# Patient Record
Sex: Male | Born: 1990 | Race: White | Hispanic: No | Marital: Married | Smoking: Former smoker
Health system: Southern US, Community
[De-identification: ages and names within clinical notes are randomized; demographics above are authoritative.]

---
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
## 2012-03-09 ENCOUNTER — Encounter (HOSPITAL_COMMUNITY): Payer: Self-pay

## 2012-03-09 ENCOUNTER — Emergency Department (HOSPITAL_COMMUNITY)
Admission: EM | Admit: 2012-03-09 | Discharge: 2012-03-09 | Disposition: A | Discharge status: Home / Self Care | Payer: Self-pay | Attending: Emergency Medicine | Admitting: Emergency Medicine

## 2012-03-09 ENCOUNTER — Emergency Department (HOSPITAL_COMMUNITY): Payer: Self-pay

## 2012-03-09 DIAGNOSIS — F17200 Nicotine dependence, unspecified, uncomplicated: Secondary | ICD-10-CM | POA: Insufficient documentation

## 2012-03-09 DIAGNOSIS — R042 Hemoptysis: Secondary | ICD-10-CM | POA: Insufficient documentation

## 2012-03-09 DIAGNOSIS — IMO0001 Reserved for inherently not codable concepts without codable children: Secondary | ICD-10-CM | POA: Insufficient documentation

## 2012-03-09 DIAGNOSIS — R05 Cough: Secondary | ICD-10-CM

## 2012-03-09 DIAGNOSIS — R070 Pain in throat: Secondary | ICD-10-CM | POA: Insufficient documentation

## 2012-03-09 LAB — RAPID STREP SCREEN (MED CTR MEBANE ONLY): Streptococcus, Group A Screen (Direct): NEGATIVE

## 2012-03-09 LAB — CBC WITH DIFFERENTIAL/PLATELET
Basophils Absolute: 0.1 10*3/uL (ref 0.0–0.1)
Lymphocytes Relative: 30 % (ref 12–46)
Lymphs Abs: 2.3 10*3/uL (ref 0.7–4.0)
MCV: 89.5 fL (ref 78.0–100.0)
Neutro Abs: 4.3 10*3/uL (ref 1.7–7.7)
Platelets: 238 10*3/uL (ref 150–400)
RBC: 5.71 MIL/uL (ref 4.22–5.81)
RDW: 11.8 % (ref 11.5–15.5)
WBC: 7.5 10*3/uL (ref 4.0–10.5)

## 2012-03-09 LAB — BASIC METABOLIC PANEL
CO2: 29 mEq/L (ref 19–32)
Chloride: 101 mEq/L (ref 96–112)
Glucose, Bld: 94 mg/dL (ref 70–99)
Potassium: 4.3 mEq/L (ref 3.5–5.1)
Sodium: 139 mEq/L (ref 135–145)

## 2012-03-09 MED ORDER — AMOXICILLIN 500 MG PO CAPS: 500.0000 mg | ORAL_CAPSULE | Freq: Three times a day (TID) | ORAL | Status: DC

## 2012-03-09 MED ORDER — ALBUTEROL SULFATE HFA 108 (90 BASE) MCG/ACT IN AERS
2.0000 | INHALATION_SPRAY | RESPIRATORY_TRACT | Status: DC | PRN
  Administered 2012-03-09: 2 via RESPIRATORY_TRACT
  Filled 2012-03-09: qty 6.7

## 2012-03-09 NOTE — ED Notes (Signed)
Patient with no complaints at this time. Respirations even and unlabored. Skin warm/dry. Discharge instructions reviewed with patient at this time. Patient given opportunity to voice concerns/ask questions. Patient discharged at this time and left Emergency Department with steady gait.   

## 2012-03-09 NOTE — ED Notes (Signed)
Inhaler teaching and instruction given to patient by RN. Patient instructed on administration, frequency, and technique for inhaler and spacer. Patient demonstrated back to RN proper use of inhaler and spacer and also gave verbal feedback/understanding.

## 2012-03-09 NOTE — ED Provider Notes (Signed)
History   This chart was scribed for Joya Gaskins, MD by Albertha Ghee Rifaie. This patient was seen in room APA09/APA09 and the patient's care was started at 3:30 PM.   CSN: [id number]  Arrival date & time 03/09/12  1433   First MD Initiated Contact with Patient 03/09/12 1506      Chief Complaint  Patient presents with  . Hematemesis    The history is provided by the patient. No language interpreter was used.    Mitchell Russell is a 21 y.o. male who presents to the Emergency Department complaining of 3 days of hemoptysis, that is described as blood coming up the throat. The symptoms are associated with CP that is worsening with taking deep breath and movement of chest, light headiness (but no syncope), and sore throat. He denies having fever, emesis, night sweat, and weight loss. Pt was seen in the ED 5 months ago for similar complaint but states that the symptoms are worse this time. Pt is a current everyday smoker and occasional alcohol user. No dyspnea on exertion No recent travel/surgery He reports it first started as sore throat and body aches.  He reports the cough started after sore throat.  He reports CP started after cough.  No fever.   No h/o DVT/PE    PMH - none  History reviewed. No pertinent past surgical history.  No family history on file.  History  Substance Use Topics  . Smoking status: Current Every Day Smoker    Types: Cigarettes  . Smokeless tobacco: Not on file  . Alcohol Use: Yes      Review of Systems  All other systems reviewed and are negative.    Allergies  Review of patient's allergies indicates no known allergies.  Home Medications   Current Outpatient Rx  Name Route Sig Dispense Refill  . ACETAMINOPHEN 500 MG PO TABS Oral Take 1,000 mg by mouth once as needed. For pain    . DOXYLAMINE SUCCINATE (SLEEP) 25 MG PO TABS Oral Take 50 mg by mouth at bedtime as needed. For sleep      BP 136/78  Pulse 110  Temp 99.2 F (37.3 C)  (Oral)  Resp 20  Ht 5\' 11"  (1.803 m)  Wt 170 lb (77.111 kg)  BMI 23.71 kg/m2  SpO2 100%  Physical Exam CONSTITUTIONAL: Well developed/well nourished HEAD AND FACE: Normocephalic/atraumatic EYES: EOMI/PERRL ENMT: Mucous membranes moist, uvula midline, no signs of active bleeding, no signs of epistaxis NECK: supple no meningeal signs SPINE:entire spine nontender CV: S1/S2 noted, no murmurs/rubs/gallops noted LUNGS: Lungs are clear to auscultation bilaterally, no apparent distress ABDOMEN: soft, nontender, no rebound or guarding GU:no cva tenderness NEURO: Pt is awake/alert, moves all extremitiesx4 EXTREMITIES: pulses normal, full ROM, no edema SKIN: warm, color normal PSYCH: no abnormalities of mood noted  ED Course  Procedures   DIAGNOSTIC STUDIES: Oxygen Saturation is 100% on room air, normal by my interpretation.    COORDINATION OF CARE: 3:35 PM Discussed treatment plan with pt at bedside and pt agreed to plan.   Pt well appearing, no distress. His pain is reproduced with movement of upper body.  He is ambulatory without distress or SOB noted.  Given that sore throat/cough started initally then hemoptysis (not massive, small amt) doubt PE He has had this before, and his repeat CXR is negative Advised to quit smoking Advised need for close followup and evaluation for hemoptysis and may need pulmonology evaluation. Pt agreeable    Labs Reviewed  RAPID STREP SCREEN     MDM  Nursing notes including past medical history and social history reviewed and considered in documentation Labs/vital reviewed and considered Previous records reviewed and considered - seen in ED previously for similar complaint      Date: 03/09/2012  Rate: 95  Rhythm: normal sinus rhythm  QRS Axis: normal  Intervals: normal  ST/T Wave abnormalities: nonspecific ST changes  Conduction Disutrbances:none  Narrative Interpretation:   Old EKG Reviewed: unchanged    I personally performed  the services described in this documentation, which was scribed in my presence. The recorded information has been reviewed and considered.             Joya Gaskins, MD 03/09/12 [phone]

## 2012-03-09 NOTE — ED Notes (Signed)
Pt reports that he has been "coughing up blood" for the past 3 days.  Pt denies any night sweats or fever.  Pt also reports a "white spot" on his throat that is painful.

## 2013-02-08 ENCOUNTER — Encounter (HOSPITAL_COMMUNITY): Payer: Self-pay | Admitting: *Deleted

## 2013-02-08 ENCOUNTER — Emergency Department (HOSPITAL_COMMUNITY): Payer: Medicaid Other

## 2013-02-08 ENCOUNTER — Emergency Department (HOSPITAL_COMMUNITY)
Admission: EM | Admit: 2013-02-08 | Discharge: 2013-02-08 | Disposition: A | Discharge status: Home / Self Care | Payer: Medicaid Other | Attending: Emergency Medicine | Admitting: Emergency Medicine

## 2013-02-08 DIAGNOSIS — R509 Fever, unspecified: Secondary | ICD-10-CM | POA: Insufficient documentation

## 2013-02-08 DIAGNOSIS — Z87891 Personal history of nicotine dependence: Secondary | ICD-10-CM | POA: Insufficient documentation

## 2013-02-08 DIAGNOSIS — J40 Bronchitis, not specified as acute or chronic: Secondary | ICD-10-CM

## 2013-02-08 DIAGNOSIS — R042 Hemoptysis: Secondary | ICD-10-CM

## 2013-02-08 LAB — BASIC METABOLIC PANEL
BUN: 15 mg/dL (ref 6–23)
CO2: 26 mEq/L (ref 19–32)
Calcium: 9.4 mg/dL (ref 8.4–10.5)
Glucose, Bld: 132 mg/dL — ABNORMAL HIGH (ref 70–99)
Sodium: 141 mEq/L (ref 135–145)

## 2013-02-08 LAB — CBC WITH DIFFERENTIAL/PLATELET
Eosinophils Relative: 1 % (ref 0–5)
HCT: 47.9 % (ref 39.0–52.0)
Hemoglobin: 17.4 g/dL — ABNORMAL HIGH (ref 13.0–17.0)
Lymphocytes Relative: 34 % (ref 12–46)
Lymphs Abs: 2.8 10*3/uL (ref 0.7–4.0)
MCH: 31.9 pg (ref 26.0–34.0)
MCV: 87.9 fL (ref 78.0–100.0)
Monocytes Absolute: 1 10*3/uL (ref 0.1–1.0)
Monocytes Relative: 12 % (ref 3–12)
Platelets: 242 10*3/uL (ref 150–400)
RBC: 5.45 MIL/uL (ref 4.22–5.81)
WBC: 8.1 10*3/uL (ref 4.0–10.5)

## 2013-02-08 MED ORDER — HYDROCODONE-ACETAMINOPHEN 5-325 MG PO TABS
2.0000 | ORAL_TABLET | Freq: Once | ORAL | Status: AC
  Administered 2013-02-08: 2 via ORAL
  Filled 2013-02-08: qty 2

## 2013-02-08 MED ORDER — ALBUTEROL SULFATE HFA 108 (90 BASE) MCG/ACT IN AERS: 2.0000 | INHALATION_SPRAY | RESPIRATORY_TRACT | Status: DC | PRN

## 2013-02-08 MED ORDER — PREDNISONE 20 MG PO TABS: ORAL_TABLET | ORAL | Status: DC

## 2013-02-08 MED ORDER — AZITHROMYCIN 250 MG PO TABS: 250.0000 mg | ORAL_TABLET | Freq: Every day | ORAL | Status: DC

## 2013-02-08 MED ORDER — IOHEXOL 350 MG/ML SOLN
100.0000 mL | Freq: Once | INTRAVENOUS | Status: AC | PRN
  Administered 2013-02-08: 100 mL via INTRAVENOUS

## 2013-02-08 NOTE — ED Notes (Signed)
Pt c/o chest pain x 3 days. Coughing up bright red blood today and states his blood pressure has been high today.

## 2013-02-08 NOTE — ED Provider Notes (Addendum)
CSN: [id number]     Arrival date & time 02/08/13  2147 History  This chart was scribed for Gilda Crease, MD by Greggory Stallion, ED Scribe. This patient was seen in room APA17/APA17 and the patient's care was started at 10:17 PM.   Chief Complaint  Patient presents with  . Hemoptysis  . Chest Pain   The history is provided by the patient. No language interpreter was used.    HPI Comments: Mitchell Russell is a 22 y.o. male who presents to the Emergency Department complaining of chest pain that started 3 days ago. He states he started having hemoptysis today. He states it hurts to breath sometimes. Pt states this happened a long time ago but it wasn't as bad as this. He denies any other associated symptoms.    History reviewed. No pertinent past medical history. History reviewed. No pertinent past surgical history. History reviewed. No pertinent family history. History  Substance Use Topics  . Smoking status: Former Smoker    Types: Cigarettes  . Smokeless tobacco: Not on file  . Alcohol Use: No    Review of Systems  Respiratory: Positive for cough.   Cardiovascular: Positive for chest pain.  All other systems reviewed and are negative.    Allergies  Review of patient's allergies indicates no known allergies.  Home Medications   Current Outpatient Rx  Name  Route  Sig  Dispense  Refill  . acetaminophen (TYLENOL) 500 MG tablet   Oral   Take 1,000 mg by mouth once as needed. For pain         . amoxicillin (AMOXIL) 500 MG capsule   Oral   Take 1 capsule (500 mg total) by mouth 3 (three) times daily.   21 capsule   0   . doxylamine, Sleep, (SLEEP AID) 25 MG tablet   Oral   Take 50 mg by mouth at bedtime as needed. For sleep          BP 145/77  Pulse 118  Temp(Src) 99.4 F (37.4 C) (Oral)  Resp 24  Ht 5\' 11"  (1.803 m)  Wt 200 lb (90.719 kg)  BMI 27.91 kg/m2  SpO2 100%  Physical Exam  Nursing note and vitals reviewed. Constitutional: He is  oriented to person, place, and time. He appears well-developed and well-nourished. No distress.  HENT:  Head: Normocephalic and atraumatic.  Right Ear: Hearing normal.  Left Ear: Hearing normal.  Nose: Nose normal.  Mouth/Throat: Oropharynx is clear and moist and mucous membranes are normal.  Eyes: Conjunctivae and EOM are normal. Pupils are equal, round, and reactive to light.  Neck: Normal range of motion. Neck supple.  Cardiovascular: Regular rhythm, S1 normal and S2 normal.  Exam reveals no gallop and no friction rub.   No murmur heard. Pulmonary/Chest: Effort normal and breath sounds normal. No respiratory distress. He exhibits no tenderness.  Abdominal: Soft. Normal appearance and bowel sounds are normal. There is no hepatosplenomegaly. There is no tenderness. There is no rebound, no guarding, no tenderness at McBurney's point and negative Murphy's sign. No hernia.  Musculoskeletal: Normal range of motion.  Neurological: He is alert and oriented to person, place, and time. He has normal strength. No cranial nerve deficit or sensory deficit. Coordination normal. GCS eye subscore is 4. GCS verbal subscore is 5. GCS motor subscore is 6.  Skin: Skin is warm, dry and intact. No rash noted. No cyanosis.  Psychiatric: He has a normal mood and affect. His speech is normal  and behavior is normal. Thought content normal.    ED Course  Procedures (including critical care time)  EKG:  Date: 02/08/2013  Rate: 116  Rhythm: sinus tachycardia  QRS Axis: normal  Intervals: normal  ST/T Wave abnormalities: normal  Conduction Disutrbances:none  Narrative Interpretation:   Old EKG Reviewed: none available    DIAGNOSTIC STUDIES: Oxygen Saturation is 100% on RA, normal by my interpretation.    COORDINATION OF CARE: 10:28 PM-Discussed treatment plan which includes CT scan with pt at bedside and pt agreed to plan.   Labs Review Labs Reviewed  CBC WITH DIFFERENTIAL - Abnormal; Notable for the  following:    Hemoglobin 17.4 (*)    MCHC 36.3 (*)    All other components within normal limits  BASIC METABOLIC PANEL   Imaging Review Ct Angio Chest Pe W/cm &/or Wo Cm  02/08/2013   CLINICAL DATA:  Chest pain, mild to cyst.  EXAM: CT ANGIOGRAPHY CHEST WITH CONTRAST  TECHNIQUE: Multidetector CT imaging of the chest was performed using the standard protocol during bolus administration of intravenous contrast. Multiplanar CT image reconstructions including MIPs were obtained to evaluate the vascular anatomy.  CONTRAST:  OMNIPAQUE IOHEXOL 350 MG/ML SOLN  COMPARISON:  CHEST x-ray 03/09/2012  FINDINGS: No filling defects in the pulmonary arteries to suggest pulmonary emboli. Heart is normal size. Aorta is normal caliber. No mediastinal, hilar, or axillary adenopathy. Visualized thyroid and chest wall soft tissues unremarkable. Lungs are clear. No focal airspace opacitiesor suspicious nodules. No effusions. Imaging into the upper abdomen shows no acute findings. No acute bony abnormality.  Review of the MIP images confirms the above findings.  IMPRESSION: No evidence of pulmonary embolus. No acute findings.   Electronically Signed   By: Charlett Nose M.D.   On: 02/08/2013 23:19    MDM  Diagnosis: Hemoptysis  Patient presents to the ER for evaluation of coughing up blood. Patient reports a cough for approximately 5 days, but for the last 3 days he has been coughing up small amounts of blood. Patient feels slightly short of breath. He has a low-grade fever. Blood work was unremarkable. Based on his symptoms, CT angiography was performed to rule out PE. No acute abnormality is seen. Symptoms most consistent with acute bronchitis. Treat with albuterol, prednisone, Zithromax.  I personally performed the services described in this documentation, which was scribed in my presence. The recorded information has been reviewed and is accurate.   Gilda Crease, MD 02/08/13 1610  Gilda Crease, MD 02/08/13 [phone]

## 2013-02-09 ENCOUNTER — Encounter (HOSPITAL_COMMUNITY): Payer: Self-pay | Admitting: Emergency Medicine

## 2013-02-09 ENCOUNTER — Emergency Department (HOSPITAL_COMMUNITY)
Admission: EM | Admit: 2013-02-09 | Discharge: 2013-02-09 | Disposition: A | Discharge status: Home / Self Care | Payer: Medicaid Other | Attending: Emergency Medicine | Admitting: Emergency Medicine

## 2013-02-09 DIAGNOSIS — R059 Cough, unspecified: Secondary | ICD-10-CM | POA: Insufficient documentation

## 2013-02-09 DIAGNOSIS — R42 Dizziness and giddiness: Secondary | ICD-10-CM | POA: Insufficient documentation

## 2013-02-09 DIAGNOSIS — Z792 Long term (current) use of antibiotics: Secondary | ICD-10-CM | POA: Insufficient documentation

## 2013-02-09 DIAGNOSIS — R079 Chest pain, unspecified: Secondary | ICD-10-CM | POA: Insufficient documentation

## 2013-02-09 DIAGNOSIS — R05 Cough: Secondary | ICD-10-CM

## 2013-02-09 DIAGNOSIS — R042 Hemoptysis: Secondary | ICD-10-CM | POA: Insufficient documentation

## 2013-02-09 DIAGNOSIS — IMO0002 Reserved for concepts with insufficient information to code with codable children: Secondary | ICD-10-CM | POA: Insufficient documentation

## 2013-02-09 DIAGNOSIS — Z87891 Personal history of nicotine dependence: Secondary | ICD-10-CM | POA: Insufficient documentation

## 2013-02-09 DIAGNOSIS — R0602 Shortness of breath: Secondary | ICD-10-CM | POA: Insufficient documentation

## 2013-02-09 LAB — BASIC METABOLIC PANEL
CO2: 23 mEq/L (ref 19–32)
Calcium: 9.6 mg/dL (ref 8.4–10.5)
Creatinine, Ser: 0.81 mg/dL (ref 0.50–1.35)
Glucose, Bld: 130 mg/dL — ABNORMAL HIGH (ref 70–99)

## 2013-02-09 LAB — CBC
MCH: 32.1 pg (ref 26.0–34.0)
MCHC: 37.4 g/dL — ABNORMAL HIGH (ref 30.0–36.0)
MCV: 85.9 fL (ref 78.0–100.0)
Platelets: 243 10*3/uL (ref 150–400)
RBC: 5.48 MIL/uL (ref 4.22–5.81)

## 2013-02-09 MED ORDER — HYDROCODONE-ACETAMINOPHEN 5-325 MG PO TABS
1.0000 | ORAL_TABLET | Freq: Once | ORAL | Status: AC
  Administered 2013-02-09: 1 via ORAL
  Filled 2013-02-09: qty 1

## 2013-02-09 MED ORDER — HYDROCODONE-ACETAMINOPHEN 5-325 MG PO TABS: ORAL_TABLET | ORAL | Status: DC

## 2013-02-09 MED ORDER — SODIUM CHLORIDE 0.9 % IV BOLUS (SEPSIS)
1000.0000 mL | Freq: Once | INTRAVENOUS | Status: AC
  Administered 2013-02-09: 1000 mL via INTRAVENOUS

## 2013-02-09 NOTE — Discharge Instructions (Signed)
Please read and follow all provided instructions.  Your diagnoses today include:  1. Hemoptysis   2. Cough     Tests performed today include:  Blood counts and electrolytes - normal  EKG - unchanged  Vital signs. See below for your results today.   Medications prescribed:   Vicodin (hydrocodone/acetaminophen) - narcotic pain medication  DO NOT drive or perform any activities that require you to be awake and alert because this medicine can make you drowsy. BE VERY CAREFUL not to take multiple medicines containing Tylenol (also called acetaminophen). Doing so can lead to an overdose which can damage your liver and cause liver failure and possibly death.  Take any prescribed medications only as directed.  Home care instructions:  Follow any educational materials contained in this packet. Continue taking the azithromycin, albuterol, and prednisone as prescribed.   Follow-up instructions: Please follow-up with your primary care provider in the next 3 days for further evaluation of your symptoms and a recheck if you are not feeling better. If you do not have a primary care doctor -- see below for referral information.   Return instructions:   Please return to the Emergency Department if you experience worsening symptoms.  Please return with worsening wheezing, shortness of breath, or difficulty breathing.  Return with persistent fever above 101F.   Please return if you have any other emergent concerns.  Additional Information:  Your vital signs today were: BP 138/87   Pulse 113   Temp(Src) 98.9 F (37.2 C) (Oral)   Resp 18   SpO2 97% If your blood pressure (BP) was elevated above 135/85 this visit, please have this repeated by your doctor within one month. -------------- RESOURCE GUIDE  Chronic Pain Problems:  [facility]:  [phone]  Patients need to be referred by their primary care doctor  Insufficient Money for Medicine:  [employer]:     call  "211"  [facility]:  [phone]  No Primary Care Doctor:  To locate a primary care doctor that accepts your insurance or provides certain services:  [facility] :   [phone]  Physician Referral Service:  [phone]  Agencies that provide inexpensive medical care:  [facility]:   [phone]  [facility]:   [phone]  [facility]:   [phone]  Women's Clinic:     [phone]  Planned Parenthood:    [phone]  [facility]:     [phone]  Medicaid-accepting [facility] Providers:  [facility]  [address], [phone], Mon-Fri 9am-7pm, Sat 9am-1pm  [facility]  [address] [place], Suite [place], [phone]  [facility]  [address] Suite [place], [phone]  [facility]  [address] [phone]  Renaye Rakers  [address], [phone]  Only accepts [employer] patients after they have their name  applied to their card  Self Pay (no insurance) in [facility]:  Sickle Cell Patients: Dr. Willey Blade, [facility]  [address] [place], [phone]  [facility]  [address] [place], [phone]  [facility] [place]  [facility] [address], [address], [facility] - [address]  [phone], Mon-Fri 9am-7pm, Sat 9am-1pm  [facility]  [address] [place], [phone]  [facility] [place]  [address]  Maurice March [phone]  [facility]  [address] [phone]  Dr. Julio Sicks  [address], [place], [phone]  [facility]  [address] [phone]  [facility]  [address], [phone]  [address] [phone]  [facility]  [address] [place], [phone], 1st & 3rd Saturday every month,  10am-1pm  Strategies for finding a Primary Care Provider:  1) Find a Doctor and Pay Out of Pocket Although you won't have to find out who is covered by your insurance plan, it is a good idea to ask around and get recommendations. You will then need to call the office and see if the doctor you have chosen will accept you as a new patient and what types of options they offer for patients who are self-pay. Some doctors offer discounts or will set up payment plans for their patients who do not have insurance, but you will need to ask so you aren't surprised when you get to your appointment.  2) Contact Your Local Health Department Not all health departments have doctors that can see patients for sick visits, but many do, so it is worth a call to see if yours does. If you don't know where your local health department is, you can check in your phone book. The CDC also has a tool to help you locate your state's health department, and many state websites also have listings of all of their local health departments.  3) Find a Walk-in Clinic If your illness is not likely to be very severe or complicated, you may want to try a walk in clinic. These are popping up all over the country in pharmacies, drugstores, and shopping centers. They're usually staffed by nurse practitioners or physician assistants that have been trained to treat common illnesses and complaints. They're usually fairly quick and inexpensive. However, if you have serious medical issues or chronic medical problems, these are probably not your best option  STD Testing:  [facility] [place], [place] [facility]  [address] [place], phone [phone] or [phone]    Monday - Friday, call for an appointment  [facility] Department of [facility], [place] [facility]  [address], [place], phone [phone] or [phone]   Monday - Friday, call for an  appointment  Abuse/Neglect:  [facility] Child Abuse Hotline:  8078122174  [facility] Child Abuse Hotline: 678-704-9438 (After Hours)  Emergency Shelter:  [facility] 7815400390  Maternity Homes:  [facility] [place] [facility]: 514-429-0462  [facility]: [phone]) [phone]  MRSA Hotline:   801 145 8248   [facility] [place]  [address]  [phone]   [place]  [address], [place]  [phone]   [facility] Dept.  [address], [place]  [phone]   [facility] Mental Health  [phone]   [facility]  [phone]   [facility] [place]  [address]  [phone], [facility] Child Abuse Hotline  365-188-5973  [phone] (After Hours)   Behavioral Health Services  Substance Abuse Resources:  Alcohol and Drug Services:     [phone]  [facility]:   [phone]  [facility]:     [phone]  [facility]:      [phone]  Residential &  Outpatient Substance Abuse Program: 858-558-2291  Psychological Services:  [facility]:   [phone]  [facility] Services:    [phone]  [facility]  [address], [place]  ACCESS LINE: [phone] or [phone]  [employer]  Dental Assistance: If unable to pay or uninsured, contact:  [facility] or [facility]. to become qualified for the adult dental clinic.  Patients with Medicaid  [facility]  [phone], [phone]  [address], [phone]  If unable to pay, or uninsured: contact [facility] [phone]) or [facility] [phone] in [place], [phone] in [facility]) to become qualified for the  adult dental clinic  Other Low-Cost Community Dental Services:  [facility]  [address] [place], [place], [place], [address]  [phone], Ext. [phone]  2nd and 4th Thursday of the month at [facility]  [facility]  [address] [place], [place], [place], [address]  [phone]  [facility]  [address], [place], [place], [address]  [phone]  [facility] Health Department  [phone]  [facility] Health Department  [phone]  [facility] Health Department  [phone]

## 2013-02-09 NOTE — ED Provider Notes (Signed)
CSN: [id number]     Arrival date & time 02/09/13  1546 History   First MD Initiated Contact with Patient 02/09/13 1555     Chief Complaint  Patient presents with  . Hemoptysis  . Chest Pain   (Consider location/radiation/quality/duration/timing/severity/associated sxs/prior Treatment) HPI Comments: Patient presents with complaint of hemoptysis and chest pain for the past 1-1/2 weeks. Patient started off with intermittent cough and pain in his left chest. At that time he had specks of blood in the mucus he was coughing up. Over the past 2 days the pain and the symptoms have worsened. He feels more short of breath and lightheaded. He has had near-syncope but no full syncope. He has had night sweats. Chest pain is constant and worsened with palpation. Patient has had a larger amount of blood in his sputum for the past 2 days. He coughed up approximately 2 teaspoons of blood during the drive to the hospital today. Patient was [facility] and had labs which were unremarkable and a chest CT scan which was negative for PE or other concerning findings. Patient has not had any vomiting or fever. No significant travel history. He denies bleeding elsewhere or easy bruising. He was prescribed an albuterol inhaler which he filled this morning. He was also prescribed azithromycin and prednisone which he has started taking. Onset of symptoms gradual. Course is worsening. Nothing makes symptoms better or worse. Patient is a previous smoker, quit 6 months ago.   Patient is a 22 y.o. male presenting with chest pain. The history is provided by the patient and a parent.  Chest Pain Associated symptoms: cough and shortness of breath   Associated symptoms: no abdominal pain, no fever, no headache, no nausea and not vomiting     History reviewed. No pertinent past medical history. History reviewed. No pertinent past surgical history. History reviewed. No pertinent family history. History  Substance  Use Topics  . Smoking status: Former Smoker    Types: Cigarettes  . Smokeless tobacco: Not on file  . Alcohol Use: No    Review of Systems  Constitutional: Negative for fever.  HENT: Negative for sore throat and rhinorrhea.   Eyes: Negative for redness.  Respiratory: Positive for cough and shortness of breath.   Cardiovascular: Positive for chest pain.  Gastrointestinal: Negative for nausea, vomiting, abdominal pain and diarrhea.  Genitourinary: Negative for dysuria.  Musculoskeletal: Negative for myalgias.  Skin: Negative for rash.  Neurological: Negative for headaches.    Allergies  Review of patient's allergies indicates no known allergies.  Home Medications   Current Outpatient Rx  Name  Route  Sig  Dispense  Refill  . albuterol (PROVENTIL HFA;VENTOLIN HFA) 108 (90 BASE) MCG/ACT inhaler   Inhalation   Inhale 2 puffs into the lungs every 4 (four) hours as needed for wheezing or shortness of breath (cough).   1 Inhaler   0   . azithromycin (ZITHROMAX) 250 MG tablet   Oral   Take 250 mg by mouth See admin instructions. Take 2 tablets once on day 1, then take 1 tablet daily for 5 days.         . predniSONE (DELTASONE) 20 MG tablet   Oral   Take 20 mg by mouth See admin instructions. Take 3 tablets once on day 1, then take 2 tablets daily for 4 days.          BP 138/87  Pulse 113  Temp(Src) 98.9 F (37.2 C) (Oral)  Resp 18  SpO2 97% Physical Exam  Nursing note and vitals reviewed. Constitutional: He appears well-developed and well-nourished.  HENT:  Head: Normocephalic and atraumatic.  Right Ear: External ear normal.  Left Ear: External ear normal.  Nose: Nose normal. No mucosal edema or rhinorrhea. No epistaxis.  Mouth/Throat: Mucous membranes are normal. Posterior oropharyngeal erythema (several tonsiliths ) present. No oropharyngeal exudate, posterior oropharyngeal edema or tonsillar abscesses.  Eyes: Conjunctivae are normal. Right eye exhibits no  discharge. Left eye exhibits no discharge.  Conjunctivae normal  Neck: Normal range of motion. Neck supple.  Cardiovascular: Normal rate, regular rhythm and normal heart sounds.   Pulmonary/Chest: Effort normal and breath sounds normal.  Abdominal: Soft. There is no tenderness. There is no rebound and no guarding.  Neurological: He is alert.  Skin: Skin is warm and dry.  Psychiatric: He has a normal mood and affect.    ED Course  Procedures (including critical care time) Labs Review Labs Reviewed  CBC - Abnormal; Notable for the following:    Hemoglobin 17.6 (*)    MCHC 37.4 (*)    All other components within normal limits  BASIC METABOLIC PANEL - Abnormal; Notable for the following:    Glucose, Bld 130 (*)    All other components within normal limits  POCT I-STAT TROPONIN I   Imaging Review Ct Angio Chest Pe W/cm &/or Wo Cm  02/08/2013   CLINICAL DATA:  Chest pain, mild to cyst.  EXAM: CT ANGIOGRAPHY CHEST WITH CONTRAST  TECHNIQUE: Multidetector CT imaging of the chest was performed using the standard protocol during bolus administration of intravenous contrast. Multiplanar CT image reconstructions including MIPs were obtained to evaluate the vascular anatomy.  CONTRAST:  OMNIPAQUE IOHEXOL 350 MG/ML SOLN  COMPARISON:  CHEST x-ray 03/09/2012  FINDINGS: No filling defects in the pulmonary arteries to suggest pulmonary emboli. Heart is normal size. Aorta is normal caliber. No mediastinal, hilar, or axillary adenopathy. Visualized thyroid and chest wall soft tissues unremarkable. Lungs are clear. No focal airspace opacitiesor suspicious nodules. No effusions. Imaging into the upper abdomen shows no acute findings. No acute bony abnormality.  Review of the MIP images confirms the above findings.  IMPRESSION: No evidence of pulmonary embolus. No acute findings.   Electronically Signed   By: Charlett Nose M.D.   On: 02/08/2013 23:19   4:55 PM Patient seen and examined. Previous work-up  reviewed. Work-up initiated.   Vital signs reviewed and are as follows: Filed Vitals:   02/09/13 1553  BP: 138/87  Pulse: 113  Temp: 98.9 F (37.2 C)  Resp: 18    Date: 02/09/2013  Rate: 119  Rhythm: sinus tachycardia  QRS Axis: normal  Intervals: normal  ST/T Wave abnormalities: nonspecific T wave changes  Conduction Disutrbances: none  Narrative Interpretation:   Old EKG Reviewed: unchanged from 02/08/2013  Pt discussed with Dr. Lynelle Doctor. Labs unremarkable/stable.   I spoke with on-call pulmonologist. Almira Coaster, with PE and pulmonary infarction ruled out, and no evidence of massive hemoptysis -- most likely etiology is bronchitis and patient should be treated appropriately.   Pt and family informed of results and conversation. They seem reassured. Will d/c to home with pain medication. Urged rest and continuation of medications prescribed previously.   6:53 PM Patient counseled on use of narcotic pain medications. Counseled not to combine these medications with others containing tylenol. Urged not to drink alcohol, drive, or perform any other activities that requires focus while taking these medications. The patient verbalizes understanding and agrees with  the plan.  Patient urged to return with worsening symptoms or other concerns. Patient verbalized understanding and agrees with plan.      MDM   1. Hemoptysis   2. Cough    CT angio yesterday, hbg stable, EKG unchanged. Discussion with pulm per above. Pt appears well. Treat as bronchitis.     Renne Crigler, PA-C 02/09/13 [phone]

## 2013-02-09 NOTE — ED Notes (Signed)
Pt sts hemoptysis x 2 weeks with pain in chest; pt spitting up BRB; pt denies nose bleed or trauma; pt sts went to [facility] and given meds for bronchitis

## 2013-02-09 NOTE — ED Provider Notes (Signed)
Medical screening examination/treatment/procedure(s) were performed by non-physician practitioner and as supervising physician I was immediately available for consultation/collaboration.    Richie Bonanno R Victorino Fatzinger, MD 02/09/13 1858 

## 2013-02-09 NOTE — ED Notes (Signed)
Patient C/O hemoptysis for 2 weeks. States that he was not sick prior to this starting. States that he has bee having left chest pain for 3 days.  C/O feeling weak and lightheaded. C/O night sweats, denies fever, chills.  States that his heart feels like it is fluttering at times.

## 2013-06-30 ENCOUNTER — Other Ambulatory Visit (HOSPITAL_COMMUNITY): Payer: Self-pay | Admitting: Pulmonary Disease

## 2013-06-30 DIAGNOSIS — R042 Hemoptysis: Secondary | ICD-10-CM

## 2013-07-07 ENCOUNTER — Ambulatory Visit (HOSPITAL_COMMUNITY)
Admission: RE | Admit: 2013-07-07 | Discharge: 2013-07-07 | Disposition: A | Discharge status: Home / Self Care | Payer: Medicaid Other | Source: Ambulatory Visit | Attending: Pulmonary Disease | Admitting: Pulmonary Disease

## 2013-07-07 ENCOUNTER — Encounter (HOSPITAL_COMMUNITY): Payer: Self-pay

## 2013-07-07 DIAGNOSIS — R042 Hemoptysis: Secondary | ICD-10-CM

## 2013-07-07 DIAGNOSIS — R079 Chest pain, unspecified: Secondary | ICD-10-CM | POA: Insufficient documentation

## 2013-07-07 MED ORDER — IOHEXOL 300 MG/ML  SOLN
80.0000 mL | Freq: Once | INTRAMUSCULAR | Status: AC | PRN
  Administered 2013-07-07: 80 mL via INTRAVENOUS

## 2013-08-03 ENCOUNTER — Encounter (HOSPITAL_COMMUNITY): Payer: Self-pay | Admitting: Emergency Medicine

## 2013-08-03 ENCOUNTER — Emergency Department (HOSPITAL_COMMUNITY)
Admission: EM | Admit: 2013-08-03 | Discharge: 2013-08-03 | Disposition: A | Discharge status: Home / Self Care | Payer: Medicaid Other | Attending: Emergency Medicine | Admitting: Emergency Medicine

## 2013-08-03 DIAGNOSIS — Z79899 Other long term (current) drug therapy: Secondary | ICD-10-CM | POA: Insufficient documentation

## 2013-08-03 DIAGNOSIS — K5289 Other specified noninfective gastroenteritis and colitis: Secondary | ICD-10-CM | POA: Insufficient documentation

## 2013-08-03 DIAGNOSIS — K529 Noninfective gastroenteritis and colitis, unspecified: Secondary | ICD-10-CM

## 2013-08-03 DIAGNOSIS — Z87891 Personal history of nicotine dependence: Secondary | ICD-10-CM | POA: Insufficient documentation

## 2013-08-03 LAB — COMPREHENSIVE METABOLIC PANEL
ALT: 29 U/L (ref 0–53)
AST: 28 U/L (ref 0–37)
Albumin: 4.2 g/dL (ref 3.5–5.2)
Alkaline Phosphatase: 100 U/L (ref 39–117)
BUN: 13 mg/dL (ref 6–23)
CALCIUM: 9.8 mg/dL (ref 8.4–10.5)
CO2: 28 meq/L (ref 19–32)
Chloride: 101 mEq/L (ref 96–112)
Creatinine, Ser: 0.89 mg/dL (ref 0.50–1.35)
GLUCOSE: 93 mg/dL (ref 70–99)
Potassium: 3.9 mEq/L (ref 3.7–5.3)
SODIUM: 141 meq/L (ref 137–147)
Total Bilirubin: 0.7 mg/dL (ref 0.3–1.2)
Total Protein: 7.1 g/dL (ref 6.0–8.3)

## 2013-08-03 LAB — CBC WITH DIFFERENTIAL/PLATELET
Basophils Absolute: 0 10*3/uL (ref 0.0–0.1)
Basophils Relative: 0 % (ref 0–1)
EOS PCT: 1 % (ref 0–5)
Eosinophils Absolute: 0.1 10*3/uL (ref 0.0–0.7)
HCT: 44.3 % (ref 39.0–52.0)
Hemoglobin: 15.9 g/dL (ref 13.0–17.0)
LYMPHS ABS: 2.6 10*3/uL (ref 0.7–4.0)
LYMPHS PCT: 31 % (ref 12–46)
MCH: 32.2 pg (ref 26.0–34.0)
MCHC: 35.9 g/dL (ref 30.0–36.0)
MCV: 89.7 fL (ref 78.0–100.0)
MONO ABS: 1.1 10*3/uL — AB (ref 0.1–1.0)
MONOS PCT: 14 % — AB (ref 3–12)
Neutro Abs: 4.4 10*3/uL (ref 1.7–7.7)
Neutrophils Relative %: 54 % (ref 43–77)
PLATELETS: 258 10*3/uL (ref 150–400)
RBC: 4.94 MIL/uL (ref 4.22–5.81)
RDW: 12.2 % (ref 11.5–15.5)
WBC: 8.2 10*3/uL (ref 4.0–10.5)

## 2013-08-03 LAB — RAPID STREP SCREEN (MED CTR MEBANE ONLY): Streptococcus, Group A Screen (Direct): NEGATIVE

## 2013-08-03 MED ORDER — KETOROLAC TROMETHAMINE 30 MG/ML IJ SOLN
30.0000 mg | Freq: Once | INTRAMUSCULAR | Status: AC
  Administered 2013-08-03: 30 mg via INTRAVENOUS
  Filled 2013-08-03: qty 1

## 2013-08-03 MED ORDER — SODIUM CHLORIDE 0.9 % IV BOLUS (SEPSIS): 1000.0000 mL | Freq: Once | INTRAVENOUS | Status: DC

## 2013-08-03 MED ORDER — PROMETHAZINE HCL 25 MG PO TABS: 25.0000 mg | ORAL_TABLET | Freq: Four times a day (QID) | ORAL | Status: DC | PRN

## 2013-08-03 MED ORDER — SODIUM CHLORIDE 0.9 % IV BOLUS (SEPSIS)
1000.0000 mL | Freq: Once | INTRAVENOUS | Status: AC
  Administered 2013-08-03: 1000 mL via INTRAVENOUS

## 2013-08-03 MED ORDER — ONDANSETRON HCL 4 MG/2ML IJ SOLN
4.0000 mg | Freq: Once | INTRAMUSCULAR | Status: AC
  Administered 2013-08-03: 4 mg via INTRAVENOUS
  Filled 2013-08-03: qty 2

## 2013-08-03 NOTE — ED Notes (Signed)
Pt c/o headache, generalized body aches, vomiting x 2 hours,  States his face is red after violent vomiting episode

## 2013-08-03 NOTE — ED Provider Notes (Signed)
CSN: [id number]     Arrival date & time 08/03/13  0007 History  This chart was scribed for Mitchell Lyonsouglas Sedrick Tober, MD by Danella Maiersaroline Early, ED Scribe. This patient was seen in room [facility] and the patient's care was started at 12:56 AM.    Chief Complaint  Patient presents with  . Headache  . Emesis   The history is provided by the patient. No language interpreter was used.   HPI Comments: Mitchell Russell is a 23 y.o. male who presents to the Emergency Department complaining of constant, gradually-worsening headache, body aches, and vomiting since 6PM tonight. Girlfriend reports 2 episodes vomiting. He states the second episode was so forceful that his face is still red. She states he has only been able to keep down smoothies and and grapes tonight. He reports diarrhea but states its been ongoing for 6 weeks. He last urinated 3-4 hours ago. He denies blood in stool. He denies sick contact.    History reviewed. No pertinent past medical history. History reviewed. No pertinent past surgical history. No family history on file. History  Substance Use Topics  . Smoking status: Former Smoker    Types: Cigarettes  . Smokeless tobacco: Not on file  . Alcohol Use: Yes    Review of Systems  HENT: Negative for ear pain.   Gastrointestinal: Positive for vomiting.  Musculoskeletal: Positive for myalgias.  Neurological: Positive for headaches.   A complete 10 system review of systems was obtained and all systems are negative except as noted in the HPI and PMH.     Allergies  Review of patient's allergies indicates no known allergies.  Home Medications   Current Outpatient Rx  Name  Route  Sig  Dispense  Refill  . ALPRAZolam (XANAX) 1 MG tablet   Oral   Take 1 mg by mouth 2 (two) times daily as needed for anxiety.         Marland Kitchen. buPROPion (WELLBUTRIN SR) 200 MG 12 hr tablet   Oral   Take 200 mg by mouth 2 (two) times daily.         Marland Kitchen. HYDROcodone-acetaminophen (NORCO/VICODIN) 5-325 MG per  tablet      Take 1-2 tablets every 6 hours as needed for severe pain   12 tablet   0   . albuterol (PROVENTIL HFA;VENTOLIN HFA) 108 (90 BASE) MCG/ACT inhaler   Inhalation   Inhale 2 puffs into the lungs every 4 (four) hours as needed for wheezing or shortness of breath (cough).   1 Inhaler   0   . azithromycin (ZITHROMAX) 250 MG tablet   Oral   Take 250 mg by mouth See admin instructions. Take 2 tablets once on day 1, then take 1 tablet daily for 5 days.         . predniSONE (DELTASONE) 20 MG tablet   Oral   Take 20 mg by mouth See admin instructions. Take 3 tablets once on day 1, then take 2 tablets daily for 4 days.          BP 148/73  Pulse 105  Temp(Src) 98.3 F (36.8 C) (Oral)  Resp 16  Ht 5\' 10"  (1.778 m)  Wt 211 lb (95.709 kg)  BMI 30.28 kg/m2  SpO2 98% Physical Exam  Nursing note and vitals reviewed. Constitutional: He is oriented to person, place, and time. He appears well-developed and well-nourished. No distress.  HENT:  Head: Normocephalic and atraumatic.  Right Ear: Tympanic membrane normal.  Left Ear: Tympanic membrane normal.  Mouth/Throat: Oropharyngeal exudate (slight) and posterior oropharyngeal erythema present.  Eyes: EOM are normal. Pupils are equal, round, and reactive to light.  Neck: Normal range of motion. Neck supple. No tracheal deviation present.  Cardiovascular: Normal rate and regular rhythm.   Pulmonary/Chest: Effort normal. No respiratory distress.  Abdominal: Soft. There is no tenderness.  Musculoskeletal: Normal range of motion.  Neurological: He is alert and oriented to person, place, and time.  Skin: Skin is warm and dry.  Psychiatric: He has a normal mood and affect. His behavior is normal.    ED Course  Procedures (including critical care time) Medications  sodium chloride 0.9 % bolus 1,000 mL (not administered)  ondansetron (ZOFRAN) injection 4 mg (not administered)  ketorolac (TORADOL) 30 MG/ML injection 30 mg (not  administered)  sodium chloride 0.9 % bolus 1,000 mL (not administered)    DIAGNOSTIC STUDIES: Oxygen Saturation is 98% on RA, normal by my interpretation.    COORDINATION OF CARE: 12:59 AM- Discussed treatment plan with pt. Pt agrees to plan.    Labs Review Labs Reviewed  RAPID STREP SCREEN  CBC WITH DIFFERENTIAL  COMPREHENSIVE METABOLIC PANEL   Imaging Review No results found.   EKG Interpretation None      MDM   Final diagnoses:  None    Patient presents here with complaints of headache, nausea, vomiting, and sore throat which started several hours prior to arrival. He noticed red spots on his face after he vomited rather violently. Workup reveals unremarkable laboratory studies and rapid strep is negative. Appears as though he has small petechiae from vomiting on his face. This appears to be a viral gastroenteritis. He is feeling better with Zofran and fluids. I feel as though he is stable for discharge with anti-medics and fluids and when necessary followup.  I personally performed the services described in this documentation, which was scribed in my presence. The recorded information has been reviewed and is accurate.      Mitchell Lyons, MD 08/03/13 [phone]

## 2013-08-03 NOTE — Discharge Instructions (Signed)
Phenergan as needed for nausea.  Return to the emergency department if you develop severe abdominal pain, bloody stool, or other new or concerning symptoms.   Viral Gastroenteritis Viral gastroenteritis is also known as stomach flu. This condition affects the stomach and intestinal tract. It can cause sudden diarrhea and vomiting. The illness typically lasts 3 to 8 days. Most people develop an immune response that eventually gets rid of the virus. While this natural response develops, the virus can make you quite ill. CAUSES  Many different viruses can cause gastroenteritis, such as rotavirus or noroviruses. You can catch one of these viruses by consuming contaminated food or water. You may also catch a virus by sharing utensils or other personal items with an infected person or by touching a contaminated surface. SYMPTOMS  The most common symptoms are diarrhea and vomiting. These problems can cause a severe loss of body fluids (dehydration) and a body salt (electrolyte) imbalance. Other symptoms may include:  Fever.  Headache.  Fatigue.  Abdominal pain. DIAGNOSIS  Your caregiver can usually diagnose viral gastroenteritis based on your symptoms and a physical exam. A stool sample may also be taken to test for the presence of viruses or other infections. TREATMENT  This illness typically goes away on its own. Treatments are aimed at rehydration. The most serious cases of viral gastroenteritis involve vomiting so severely that you are not able to keep fluids down. In these cases, fluids must be given through an intravenous line (IV). HOME CARE INSTRUCTIONS   Drink enough fluids to keep your urine clear or pale yellow. Drink small amounts of fluids frequently and increase the amounts as tolerated.  Ask your caregiver for specific rehydration instructions.  Avoid:  Foods high in sugar.  Alcohol.  Carbonated drinks.  Tobacco.  Juice.  Caffeine drinks.  Extremely hot or cold  fluids.  Fatty, greasy foods.  Too much intake of anything at one time.  Dairy products until 24 to 48 hours after diarrhea stops.  You may consume probiotics. Probiotics are active cultures of beneficial bacteria. They may lessen the amount and number of diarrheal stools in adults. Probiotics can be found in yogurt with active cultures and in supplements.  Wash your hands well to avoid spreading the virus.  Only take over-the-counter or prescription medicines for pain, discomfort, or fever as directed by your caregiver. Do not give aspirin to children. Antidiarrheal medicines are not recommended.  Ask your caregiver if you should continue to take your regular prescribed and over-the-counter medicines.  Keep all follow-up appointments as directed by your caregiver. SEEK IMMEDIATE MEDICAL CARE IF:   You are unable to keep fluids down.  You do not urinate at least once every 6 to 8 hours.  You develop shortness of breath.  You notice blood in your stool or vomit. This may look like coffee grounds.  You have abdominal pain that increases or is concentrated in one small area (localized).  You have persistent vomiting or diarrhea.  You have a fever.  The patient is a child younger than 3 months, and he or she has a fever.  The patient is a child older than 3 months, and he or she has a fever and persistent symptoms.  The patient is a child older than 3 months, and he or she has a fever and symptoms suddenly get worse.  The patient is a baby, and he or she has no tears when crying. MAKE SURE YOU:   Understand these instructions.  Will  watch your condition. °· Will get help right away if you are not doing well or get worse. °Document Released: 05/18/2005 Document Revised: 08/10/2011 Document Reviewed: 03/04/2011 °Exit[employer]® Patient Information ©2014 [employer]. ° °

## 2013-08-04 LAB — CULTURE, GROUP A STREP

## 2015-09-19 ENCOUNTER — Encounter (HOSPITAL_COMMUNITY): Payer: Self-pay

## 2015-09-19 ENCOUNTER — Emergency Department (HOSPITAL_COMMUNITY): Payer: Self-pay

## 2015-09-19 ENCOUNTER — Emergency Department (HOSPITAL_COMMUNITY)
Admission: EM | Admit: 2015-09-19 | Discharge: 2015-09-19 | Disposition: A | Discharge status: Home / Self Care | Payer: Self-pay | Attending: Emergency Medicine | Admitting: Emergency Medicine

## 2015-09-19 DIAGNOSIS — Y999 Unspecified external cause status: Secondary | ICD-10-CM | POA: Insufficient documentation

## 2015-09-19 DIAGNOSIS — S0101XA Laceration without foreign body of scalp, initial encounter: Secondary | ICD-10-CM | POA: Insufficient documentation

## 2015-09-19 DIAGNOSIS — Z87891 Personal history of nicotine dependence: Secondary | ICD-10-CM | POA: Insufficient documentation

## 2015-09-19 DIAGNOSIS — W228XXA Striking against or struck by other objects, initial encounter: Secondary | ICD-10-CM | POA: Insufficient documentation

## 2015-09-19 DIAGNOSIS — Y929 Unspecified place or not applicable: Secondary | ICD-10-CM | POA: Insufficient documentation

## 2015-09-19 DIAGNOSIS — Y939 Activity, unspecified: Secondary | ICD-10-CM | POA: Insufficient documentation

## 2015-09-19 LAB — CBG MONITORING, ED: Glucose-Capillary: 81 mg/dL (ref 65–99)

## 2015-09-19 MED ORDER — LIDOCAINE-EPINEPHRINE (PF) 1 %-1:200000 IJ SOLN
INTRAMUSCULAR | Status: AC
  Filled 2015-09-19: qty 30

## 2015-09-19 NOTE — ED Notes (Signed)
Laceration noted to left posterior scalp with bleeding controlled at this time.

## 2015-09-19 NOTE — ED Notes (Signed)
Pt was at doctors visit for sons circumcision and passed out. Per Dr. Despina HiddenEure pt had + LOC. Laceration noted to back of head. C/o "lightheaded" feeling.

## 2015-09-19 NOTE — ED Provider Notes (Signed)
CSN: [id number]     Arrival date & time 09/19/15  1646 History   First MD Initiated Contact with Patient 09/19/15 1657     Chief Complaint  Patient presents with  . Head Laceration     HPI  Patient presents for evaluation of a head injury, and scalp laceration. He was standing watching his son circumcision. He states he remembers feeling nauseated and his face feeling hot and flushed. Per the physician the patient fell straight backwards. Struck his head against the bottom edge of a flat face of a door. Had loss of consciousness for a few minutes. Became awake and alert. Brought here by private conveyance. Has a small laceration of the back of his head with no bleeding upon arrival.  History reviewed. No pertinent past medical history. History reviewed. No pertinent past surgical history. No family history on file. Social History  Substance Use Topics  . Smoking status: Former Smoker    Types: Cigarettes  . Smokeless tobacco: None  . Alcohol Use: Yes    Review of Systems  Constitutional: Negative for fever, chills, diaphoresis, appetite change and fatigue.  HENT: Negative for mouth sores, sore throat and trouble swallowing.   Eyes: Negative for visual disturbance.  Respiratory: Negative for cough, chest tightness, shortness of breath and wheezing.   Cardiovascular: Negative for chest pain.  Gastrointestinal: Negative for nausea, vomiting, abdominal pain, diarrhea and abdominal distention.  Endocrine: Negative for polydipsia, polyphagia and polyuria.  Genitourinary: Negative for dysuria, frequency and hematuria.  Musculoskeletal: Negative for gait problem.  Skin: Negative for color change, pallor and rash.  Neurological: Negative for dizziness, syncope, light-headedness and headaches.  Hematological: Does not bruise/bleed easily.  Psychiatric/Behavioral: Negative for behavioral problems and confusion.      Allergies  Review of patient's allergies indicates no known  allergies.  Home Medications   Prior to Admission medications   Medication Sig Start Date End Date Taking? Authorizing Provider  oxymetazoline (NASAL SPRAY 12 HOUR) 0.05 % nasal spray Place 1 spray into both nostrils 2 (two) times daily as needed for congestion.   Yes Historical Provider, MD  tetrahydrozoline (EYE DROPS) 0.05 % ophthalmic solution Place 1 drop into both eyes daily as needed (for irritation/dry eye relief).   Yes Historical Provider, MD   BP 122/73 mmHg  Pulse 73  Temp(Src) 98.8 F (37.1 C) (Oral)  Resp 16  Ht 5\' 11"  (1.803 m)  Wt 205 lb (92.987 kg)  BMI 28.60 kg/m2  SpO2 100% Physical Exam  ED Course  Procedures (including critical care time) Labs Review Labs Reviewed  CBG MONITORING, ED    Imaging Review Ct Head Wo Contrast  09/19/2015  CLINICAL DATA:  Syncopal event during son circumcision EXAM: CT HEAD WITHOUT CONTRAST CT CERVICAL SPINE WITHOUT CONTRAST TECHNIQUE: Multidetector CT imaging of the head and cervical spine was performed following the standard protocol without intravenous contrast. Multiplanar CT image reconstructions of the cervical spine were also generated. COMPARISON:  None. FINDINGS: CT HEAD FINDINGS Bony calvarium is intact. No findings to suggest acute hemorrhage, acute infarction or space-occupying mass lesion are noted. CT CERVICAL SPINE FINDINGS Seven cervical segments are well visualized. Vertebral body height is well maintained. No acute fracture or acute facet abnormality is seen. The visualized lung apices are within normal limits. The surrounding soft tissues are unremarkable. IMPRESSION: CT of the head:  No acute intracranial abnormality noted. CT of the cervical spine:  No acute abnormality noted. Electronically Signed   By: Eulah PontMark  Lukens M.D.  On: 09/19/2015 19:07   Ct Cervical Spine Wo Contrast  09/19/2015  CLINICAL DATA:  Syncopal event during son circumcision EXAM: CT HEAD WITHOUT CONTRAST CT CERVICAL SPINE WITHOUT CONTRAST  TECHNIQUE: Multidetector CT imaging of the head and cervical spine was performed following the standard protocol without intravenous contrast. Multiplanar CT image reconstructions of the cervical spine were also generated. COMPARISON:  None. FINDINGS: CT HEAD FINDINGS Bony calvarium is intact. No findings to suggest acute hemorrhage, acute infarction or space-occupying mass lesion are noted. CT CERVICAL SPINE FINDINGS Seven cervical segments are well visualized. Vertebral body height is well maintained. No acute fracture or acute facet abnormality is seen. The visualized lung apices are within normal limits. The surrounding soft tissues are unremarkable. IMPRESSION: CT of the head:  No acute intracranial abnormality noted. CT of the cervical spine:  No acute abnormality noted. Electronically Signed   By: Alcide Clever M.D.   On: 09/19/2015 19:07   I have personally reviewed and evaluated these images and lab results as part of my medical decision-making.   EKG Interpretation None      MDM   Final diagnoses:  Scalp laceration, initial encounter    LACERATION REPAIR Performed by: Claudean Kinds Authorized by: Claudean Kinds Consent: Verbal consent obtained. Risks and benefits: risks, benefits and alternatives were discussed Consent given by: patient Patient identity confirmed: provided demographic data Prepped and Draped in normal sterile fashion Wound explored  Laceration Location: left occipital scalp   Laceration Length: 3cm  No Foreign Bodies seen or palpated  Anesthesia: local infiltration  Local anesthetic: lidocaine 1% c epinephrine  Anesthetic total: 4 ml  Irrigation method: syringe Amount of cleaning: standard  Skin closure: staples  Number of sutures: 4  Technique: staples  Patient tolerance: Patient tolerated the procedure well with no immediate complications.     Rolland Porter, MD 09/20/15 [id number]

## 2015-09-19 NOTE — Discharge Instructions (Signed)
Staple removal in 7-10 days.  Head Injury, Adult You have a head injury. Headaches and throwing up (vomiting) are common after a head injury. It should be easy to wake up from sleeping. Sometimes you must stay in the hospital. Most problems happen within the first 24 hours. Side effects may occur up to 7-10 days after the injury.  WHAT ARE THE TYPES OF HEAD INJURIES? Head injuries can be as minor as a bump. Some head injuries can be more severe. More severe head injuries include:  A jarring injury to the brain (concussion).  A bruise of the brain (contusion). This mean there is bleeding in the brain that can cause swelling.  A cracked skull (skull fracture).  Bleeding in the brain that collects, clots, and forms a bump (hematoma). WHEN SHOULD I GET HELP RIGHT AWAY?   You are confused or sleepy.  You cannot be woken up.  You feel sick to your stomach (nauseous) or keep throwing up (vomiting).  Your dizziness or unsteadiness is getting worse.  You have very bad, lasting headaches that are not helped by medicine. Take medicines only as told by your doctor.  You cannot use your arms or legs like normal.  You cannot walk.  You notice changes in the black spots in the center of the colored part of your eye (pupil).  You have clear or bloody fluid coming from your nose or ears.  You have trouble seeing. During the next 24 hours after the injury, you must stay with someone who can watch you. This person should get help right away (call 911 in the [place] if you start to shake and are not able to control it (have seizures), you pass out, or you are unable to wake up. HOW CAN I PREVENT A HEAD INJURY IN THE FUTURE?  Wear seat belts.  Wear a helmet while bike riding and playing sports like football.  Stay away from dangerous activities around the house. WHEN CAN I RETURN TO NORMAL ACTIVITIES AND ATHLETICS? See your doctor before doing these activities. You should not do normal  activities or play contact sports until 1 week after the following symptoms have stopped:  Headache that does not go away.  Dizziness.  Poor attention.  Confusion.  Memory problems.  Sickness to your stomach or throwing up.  Tiredness.  Fussiness.  Bothered by bright lights or loud noises.  Anxiousness or depression.  Restless sleep. MAKE SURE YOU:   Understand these instructions.  Will watch your condition.  Will get help right away if you are not doing well or get worse.   This information is not intended to replace advice given to you by your health care provider. Make sure you discuss any questions you have with your health care provider.   Document Released: 04/30/2008 Document Revised: 06/08/2014 Document Reviewed: 01/23/2013 Elsevier Interactive Patient Education 2016 [employer]  Laceration Care, Adult A laceration is a cut that goes through all layers of the skin. The cut also goes into the tissue that is right under the skin. Some cuts heal on their own. Others need to be closed with stitches (sutures), staples, skin adhesive strips, or wound glue. Taking care of your cut lowers your risk of infection and helps your cut to heal better. HOW TO TAKE CARE OF YOUR CUT For stitches or staples:  Keep the wound clean and dry.  If you were given a bandage (dressing), you should change it at least one time per day or as told  by your doctor. You should also change it if it gets wet or dirty.  Keep the wound completely dry for the first 24 hours or as told by your doctor. After that time, you may take a shower or a bath. However, make sure that the wound is not soaked in water until after the stitches or staples have been removed.  Clean the wound one time each day or as told by your doctor:  Wash the wound with soap and water.  Rinse the wound with water until all of the soap comes off.  Pat the wound dry with a clean towel. Do not rub the wound.  After you  clean the wound, put a thin layer of antibiotic ointment on it as told by your doctor. This ointment:  Helps to prevent infection.  Keeps the bandage from sticking to the wound.  Have your stitches or staples removed as told by your doctor. If your doctor used skin adhesive strips:   Keep the wound clean and dry.  If you were given a bandage, you should change it at least one time per day or as told by your doctor. You should also change it if it gets dirty or wet.  Do not get the skin adhesive strips wet. You can take a shower or a bath, but be careful to keep the wound dry.  If the wound gets wet, pat it dry with a clean towel. Do not rub the wound.  Skin adhesive strips fall off on their own. You can trim the strips as the wound heals. Do not remove any strips that are still stuck to the wound. They will fall off after a while. If your doctor used wound glue:  Try to keep your wound dry, but you may briefly wet it in the shower or bath. Do not soak the wound in water, such as by swimming.  After you take a shower or a bath, gently pat the wound dry with a clean towel. Do not rub the wound.  Do not do any activities that will make you really sweaty until the skin glue has fallen off on its own.  Do not apply liquid, cream, or ointment medicine to your wound while the skin glue is still on.  If you were given a bandage, you should change it at least one time per day or as told by your doctor. You should also change it if it gets dirty or wet.  If a bandage is placed over the wound, do not let the tape for the bandage touch the skin glue.  Do not pick at the glue. The skin glue usually stays on for 5-10 days. Then, it falls off of the skin. General Instructions  To help prevent scarring, make sure to cover your wound with sunscreen whenever you are outside after stitches are removed, after adhesive strips are removed, or when wound glue stays in place and the wound is healed. Make  sure to wear a sunscreen of at least 30 SPF.  Take over-the-counter and prescription medicines only as told by your doctor.  If you were given antibiotic medicine or ointment, take or apply it as told by your doctor. Do not stop using the antibiotic even if your wound is getting better.  Do not scratch or pick at the wound.  Keep all follow-up visits as told by your doctor. This is important.  Check your wound every day for signs of infection. Watch for:  Redness, swelling, or pain.  Fluid, blood, or pus.  Raise (elevate) the injured area above the level of your heart while you are sitting or lying down, if possible. GET HELP IF:  You got a tetanus shot and you have any of these problems at the injection site:  Swelling.  Very bad pain.  Redness.  Bleeding.  You have a fever.  A wound that was closed breaks open.  You notice a bad smell coming from your wound or your bandage.  You notice something coming out of the wound, such as wood or glass.  Medicine does not help your pain.  You have more redness, swelling, or pain at the site of your wound.  You have fluid, blood, or pus coming from your wound.  You notice a change in the color of your skin near your wound.  You need to change the bandage often because fluid, blood, or pus is coming from the wound.  You start to have a new rash.  You start to have numbness around the wound. GET HELP RIGHT AWAY IF:  You have very bad swelling around the wound.  Your pain suddenly gets worse and is very bad.  You notice painful lumps near the wound or on skin that is anywhere on your body.  You have a red streak going away from your wound.  The wound is on your hand or foot and you cannot move a finger or toe like you usually can.  The wound is on your hand or foot and you notice that your fingers or toes look pale or bluish.   This information is not intended to replace advice given to you by your health care  provider. Make sure you discuss any questions you have with your health care provider.   Document Released: 11/04/2007 Document Revised: 10/02/2014 Document Reviewed: 05/14/2014 Elsevier Interactive Patient Education [employer]

## 2017-08-16 ENCOUNTER — Emergency Department (HOSPITAL_COMMUNITY): Payer: Self-pay

## 2017-08-16 ENCOUNTER — Encounter (HOSPITAL_COMMUNITY): Payer: Self-pay | Admitting: Emergency Medicine

## 2017-08-16 ENCOUNTER — Emergency Department (HOSPITAL_COMMUNITY)
Admission: EM | Admit: 2017-08-16 | Discharge: 2017-08-16 | Disposition: A | Discharge status: Home / Self Care | Payer: Self-pay | Attending: Emergency Medicine | Admitting: Emergency Medicine

## 2017-08-16 ENCOUNTER — Other Ambulatory Visit: Payer: Self-pay

## 2017-08-16 DIAGNOSIS — R042 Hemoptysis: Secondary | ICD-10-CM | POA: Insufficient documentation

## 2017-08-16 DIAGNOSIS — Z87891 Personal history of nicotine dependence: Secondary | ICD-10-CM | POA: Insufficient documentation

## 2017-08-16 LAB — CBC
HEMATOCRIT: 49.9 % (ref 39.0–52.0)
Hemoglobin: 17.2 g/dL — ABNORMAL HIGH (ref 13.0–17.0)
MCH: 31.2 pg (ref 26.0–34.0)
MCHC: 34.5 g/dL (ref 30.0–36.0)
MCV: 90.4 fL (ref 78.0–100.0)
PLATELETS: 249 10*3/uL (ref 150–400)
RBC: 5.52 MIL/uL (ref 4.22–5.81)
RDW: 12 % (ref 11.5–15.5)
WBC: 8.1 10*3/uL (ref 4.0–10.5)

## 2017-08-16 LAB — COMPREHENSIVE METABOLIC PANEL
ALT: 19 U/L (ref 17–63)
AST: 25 U/L (ref 15–41)
Albumin: 4.8 g/dL (ref 3.5–5.0)
Alkaline Phosphatase: 73 U/L (ref 38–126)
Anion gap: 11 (ref 5–15)
BUN: 18 mg/dL (ref 6–20)
CHLORIDE: 104 mmol/L (ref 101–111)
CO2: 23 mmol/L (ref 22–32)
CREATININE: 0.87 mg/dL (ref 0.61–1.24)
Calcium: 9.5 mg/dL (ref 8.9–10.3)
GFR calc non Af Amer: >60 mL/min (ref >60)
Glucose, Bld: 87 mg/dL (ref 65–99)
Potassium: 3.6 mmol/L (ref 3.5–5.1)
SODIUM: 138 mmol/L (ref 135–145)
Total Bilirubin: 1.3 mg/dL — ABNORMAL HIGH (ref 0.3–1.2)
Total Protein: 8.1 g/dL (ref 6.5–8.1)

## 2017-08-16 LAB — D-DIMER, QUANTITATIVE: D-Dimer, Quant: <0.27 ug/mL-FEU (ref 0.00–0.50)

## 2017-08-16 MED ORDER — HYDROCODONE-HOMATROPINE 5-1.5 MG/5ML PO SYRP: 5.0000 mL | ORAL_SOLUTION | Freq: Four times a day (QID) | ORAL | 0 refills | Status: DC | PRN

## 2017-08-16 MED ORDER — DEXAMETHASONE 4 MG PO TABS: 4.0000 mg | ORAL_TABLET | Freq: Two times a day (BID) | ORAL | 0 refills | Status: DC
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
## 2017-08-16 NOTE — Discharge Instructions (Signed)
Your vital signs are non-acute.  Your x-ray is negative for acute problem.  Your blood test are negative for blood clot in the lung or other emergent changes.  Please use Decadron 2 times daily.  Use Hycodan for cough if needed. This medication may cause drowsiness. Please do not drink, drive, or participate in activity that requires concentration while taking this medication.  Please make an appointment with Dr. Juanetta GoslingHawkins or a member of his team as soon as possible for pulmonary evaluation of your hemoptysis.
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
## 2020-05-08 ENCOUNTER — Encounter (HOSPITAL_COMMUNITY): Payer: Self-pay

## 2020-05-08 ENCOUNTER — Other Ambulatory Visit: Payer: Self-pay

## 2020-05-08 ENCOUNTER — Emergency Department (HOSPITAL_COMMUNITY): Payer: Self-pay

## 2020-05-08 ENCOUNTER — Emergency Department (HOSPITAL_COMMUNITY)
Admission: EM | Admit: 2020-05-08 | Discharge: 2020-05-08 | Disposition: A | Discharge status: Home / Self Care | Payer: Self-pay | Attending: Emergency Medicine | Admitting: Emergency Medicine

## 2020-05-08 DIAGNOSIS — Z87891 Personal history of nicotine dependence: Secondary | ICD-10-CM | POA: Insufficient documentation

## 2020-05-08 DIAGNOSIS — Z23 Encounter for immunization: Secondary | ICD-10-CM | POA: Insufficient documentation

## 2020-05-08 DIAGNOSIS — S060X0A Concussion without loss of consciousness, initial encounter: Secondary | ICD-10-CM

## 2020-05-08 DIAGNOSIS — Y92410 Unspecified street and highway as the place of occurrence of the external cause: Secondary | ICD-10-CM | POA: Insufficient documentation

## 2020-05-08 DIAGNOSIS — S62526A Nondisplaced fracture of distal phalanx of unspecified thumb, initial encounter for closed fracture: Secondary | ICD-10-CM

## 2020-05-08 DIAGNOSIS — R55 Syncope and collapse: Secondary | ICD-10-CM | POA: Insufficient documentation

## 2020-05-08 DIAGNOSIS — S62524A Nondisplaced fracture of distal phalanx of right thumb, initial encounter for closed fracture: Secondary | ICD-10-CM | POA: Insufficient documentation

## 2020-05-08 MED ORDER — OXYCODONE-ACETAMINOPHEN 5-325 MG PO TABS
1.0000 | ORAL_TABLET | Freq: Once | ORAL | Status: AC
  Administered 2020-05-08: 1 via ORAL
  Filled 2020-05-08: qty 1

## 2020-05-08 MED ORDER — TETANUS-DIPHTH-ACELL PERTUSSIS 5-2.5-18.5 LF-MCG/0.5 IM SUSY
0.5000 mL | PREFILLED_SYRINGE | Freq: Once | INTRAMUSCULAR | Status: AC
  Administered 2020-05-08: 0.5 mL via INTRAMUSCULAR
  Filled 2020-05-08: qty 0.5

## 2020-05-08 MED ORDER — NAPROXEN 500 MG PO TABS: 500.0000 mg | ORAL_TABLET | Freq: Two times a day (BID) | ORAL | 0 refills | Status: DC
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
## 2020-05-08 NOTE — ED Provider Notes (Signed)
A change of shift, care signed out from Dr. Wilkie Aye, this patient has a nondisplaced fracture of the distal phalanx of the thumb, no other significant traumatic injuries, no signs of injury to the head on CT scan of the brain, the patient was informed of these results, stable for discharge, finger splint placed, no thumb spica is available, orthopedic follow-up outpatient   Eber Hong, MD 05/08/20 [phone]

## 2020-05-08 NOTE — Discharge Instructions (Addendum)
You do have a broken bone at the tip of your thumb, this should heal well without surgery however I would recommend that you do follow-up with an orthopedic surgeon.  You may see Dr. Romeo Apple locally if you do not have a local family doctor or surgeon.  You may take naproxyn ibuprofen or Tylenol for pain, use the finger splint, do not use your hand until you have been cleared by the surgeon  Please take Naprosyn, 500mg  by mouth twice daily as needed for pain - this in an antiinflammatory medicine (NSAID) and is similar to ibuprofen - many people feel that it is stronger than ibuprofen and it is easier to take since it is a smaller pill.  Please use this only for 1 week - if your pain persists, you will need to follow up with your doctor in the office for ongoing guidance and pain control.
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
## 2022-07-16 ENCOUNTER — Emergency Department (HOSPITAL_COMMUNITY): Payer: Self-pay

## 2022-07-16 ENCOUNTER — Emergency Department (HOSPITAL_COMMUNITY)
Admission: EM | Admit: 2022-07-16 | Discharge: 2022-07-16 | Disposition: A | Discharge status: Home / Self Care | Payer: Self-pay | Attending: Student | Admitting: Student

## 2022-07-16 ENCOUNTER — Encounter (HOSPITAL_COMMUNITY): Payer: Self-pay

## 2022-07-16 ENCOUNTER — Other Ambulatory Visit: Payer: Self-pay

## 2022-07-16 DIAGNOSIS — R0789 Other chest pain: Secondary | ICD-10-CM | POA: Insufficient documentation

## 2022-07-16 DIAGNOSIS — F410 Panic disorder [episodic paroxysmal anxiety] without agoraphobia: Secondary | ICD-10-CM

## 2022-07-16 DIAGNOSIS — F1290 Cannabis use, unspecified, uncomplicated: Secondary | ICD-10-CM

## 2022-07-16 DIAGNOSIS — R000 Tachycardia, unspecified: Secondary | ICD-10-CM | POA: Insufficient documentation

## 2022-07-16 DIAGNOSIS — R0602 Shortness of breath: Secondary | ICD-10-CM | POA: Insufficient documentation

## 2022-07-16 DIAGNOSIS — Z87891 Personal history of nicotine dependence: Secondary | ICD-10-CM | POA: Insufficient documentation

## 2022-07-16 LAB — COMPREHENSIVE METABOLIC PANEL
ALT: 24 U/L (ref 0–44)
AST: 22 U/L (ref 15–41)
Albumin: 4.5 g/dL (ref 3.5–5.0)
Alkaline Phosphatase: 75 U/L (ref 38–126)
Anion gap: 11 (ref 5–15)
BUN: 17 mg/dL (ref 6–20)
CO2: 23 mmol/L (ref 22–32)
Calcium: 9.2 mg/dL (ref 8.9–10.3)
Chloride: 98 mmol/L (ref 98–111)
Creatinine, Ser: 1.06 mg/dL (ref 0.61–1.24)
GFR, Estimated: >60 mL/min (ref >60)
Glucose, Bld: 113 mg/dL — ABNORMAL HIGH (ref 70–99)
Potassium: 3.1 mmol/L — ABNORMAL LOW (ref 3.5–5.1)
Sodium: 132 mmol/L — ABNORMAL LOW (ref 135–145)
Total Bilirubin: 0.9 mg/dL (ref 0.3–1.2)
Total Protein: 7.5 g/dL (ref 6.5–8.1)

## 2022-07-16 LAB — CBC WITH DIFFERENTIAL/PLATELET
Abs Immature Granulocytes: 0.02 10*3/uL (ref 0.00–0.07)
Basophils Absolute: 0.1 10*3/uL (ref 0.0–0.1)
Basophils Relative: 1 %
Eosinophils Absolute: 0.1 10*3/uL (ref 0.0–0.5)
Eosinophils Relative: 1 %
HCT: 51.4 % (ref 39.0–52.0)
Hemoglobin: 18.4 g/dL — ABNORMAL HIGH (ref 13.0–17.0)
Immature Granulocytes: 0 %
Lymphocytes Relative: 33 %
Lymphs Abs: 3.3 10*3/uL (ref 0.7–4.0)
MCH: 31.7 pg (ref 26.0–34.0)
MCHC: 35.8 g/dL (ref 30.0–36.0)
MCV: 88.6 fL (ref 80.0–100.0)
Monocytes Absolute: 1.1 10*3/uL — ABNORMAL HIGH (ref 0.1–1.0)
Monocytes Relative: 11 %
Neutro Abs: 5.4 10*3/uL (ref 1.7–7.7)
Neutrophils Relative %: 54 %
Platelets: 302 10*3/uL (ref 150–400)
RBC: 5.8 MIL/uL (ref 4.22–5.81)
RDW: 11.9 % (ref 11.5–15.5)
WBC: 10 10*3/uL (ref 4.0–10.5)
nRBC: 0 % (ref 0.0–0.2)

## 2022-07-16 LAB — URINALYSIS, ROUTINE W REFLEX MICROSCOPIC
Bilirubin Urine: NEGATIVE
Glucose, UA: NEGATIVE mg/dL
Hgb urine dipstick: NEGATIVE
Ketones, ur: NEGATIVE mg/dL
Leukocytes,Ua: NEGATIVE
Nitrite: NEGATIVE
Protein, ur: NEGATIVE mg/dL
Specific Gravity, Urine: 1.02 (ref 1.005–1.030)
pH: 8 (ref 5.0–8.0)

## 2022-07-16 LAB — RAPID URINE DRUG SCREEN, HOSP PERFORMED
Amphetamines: NOT DETECTED
Barbiturates: NOT DETECTED
Benzodiazepines: NOT DETECTED
Cocaine: NOT DETECTED
Opiates: NOT DETECTED
Tetrahydrocannabinol: POSITIVE — AB

## 2022-07-16 LAB — TROPONIN I (HIGH SENSITIVITY)
Troponin I (High Sensitivity): <2 ng/L (ref <18)
Troponin I (High Sensitivity): 3 ng/L (ref <18)

## 2022-07-16 MED ORDER — LORAZEPAM 2 MG/ML IJ SOLN
0.5000 mg | Freq: Once | INTRAMUSCULAR | Status: AC
  Administered 2022-07-16: 0.5 mg via INTRAVENOUS
  Filled 2022-07-16: qty 1

## 2022-07-16 MED ORDER — LORAZEPAM 2 MG/ML IJ SOLN
0.5000 mg | Freq: Once | INTRAMUSCULAR | Status: AC
  Administered 2022-07-16: 0.5 mg via INTRAVENOUS

## 2022-07-16 MED ORDER — LEVALBUTEROL HCL 1.25 MG/0.5ML IN NEBU
1.2500 mg | INHALATION_SOLUTION | Freq: Once | RESPIRATORY_TRACT | Status: AC
  Administered 2022-07-16: 1.25 mg via RESPIRATORY_TRACT
  Filled 2022-07-16: qty 0.5

## 2022-07-16 MED ORDER — LORAZEPAM 2 MG/ML IJ SOLN
1.0000 mg | Freq: Once | INTRAMUSCULAR | Status: DC
  Filled 2022-07-16: qty 1

## 2022-07-16 MED ORDER — LACTATED RINGERS IV BOLUS
1000.0000 mL | Freq: Once | INTRAVENOUS | Status: AC
  Administered 2022-07-16: 1000 mL via INTRAVENOUS

## 2022-07-16 MED ORDER — IOHEXOL 350 MG/ML SOLN
75.0000 mL | Freq: Once | INTRAVENOUS | Status: AC | PRN
  Administered 2022-07-16: 75 mL via INTRAVENOUS

## 2022-07-16 MED ORDER — LORAZEPAM 1 MG PO TABS
1.0000 mg | ORAL_TABLET | Freq: Once | ORAL | Status: AC
  Administered 2022-07-16: 1 mg via ORAL
  Filled 2022-07-16: qty 1

## 2022-07-16 MED ORDER — SODIUM CHLORIDE 0.9 % IV BOLUS
1000.0000 mL | Freq: Once | INTRAVENOUS | Status: AC
  Administered 2022-07-16: 1000 mL via INTRAVENOUS

## 2022-07-16 NOTE — ED Provider Notes (Signed)
Pt signed out by Dr. Matilde Sprang pending symptomatic improvement.  Pt did use MJ today.  He had some cp and felt like he was having a panic attack.  Pt's HR was 165 upon arrival.  He has received multiple doses of ativan and is feeling much better.  HR is now in the 80s. Troponins neg.  No infections.  CT angio chest films reviewed by me.  I agree with the radiologist.  Neg for anything acute.  Pt is encouraged to avoid MJ.  He is to f/u with pcp.  Return if worse.    Isla Pence, MD 07/16/22 2025

## 2022-07-16 NOTE — ED Notes (Signed)
MSE not signed EDP at bedside.

## 2022-07-16 NOTE — ED Triage Notes (Signed)
Pt comes into ED stating he woke up with CP on 07/15/2022. Patient states an 8/10 pain. Patient states pain down left arm and left neck/ jaw pain. Pt admits to MD of smoking marijuana earlier.

## 2022-07-16 NOTE — ED Provider Notes (Signed)
[facility] Provider Note  CSN: [id number] Arrival date & time: 07/16/22 1431  Chief Complaint(s) Chest Pain  HPI Mitchell Russell is a 32 y.o. male who presents emergency department for evaluation of chest pain and rapid heart rate.  Patient states that over the last 24 hours he has become increasingly anxious and woke up this morning with worsening shortness of breath, chest tightness and pain in the left arm.  He arrives significant tachycardic but denies abdominal pain, nausea, vomiting, diaphoresis or other systemic symptoms.  Denies drug use today.   Past Medical History History reviewed. No pertinent past medical history. There are no problems to display for this patient.  Home Medication(s) Prior to Admission medications   Medication Sig Start Date End Date Taking? Authorizing Provider  dexamethasone (DECADRON) 4 MG tablet Take 1 tablet (4 mg total) by mouth 2 (two) times daily with a meal. 08/16/17   Lily Kocher, PA-C  HYDROcodone-homatropine [facility]) 5-1.5 MG/5ML syrup Take 5 mLs by mouth every 6 (six) hours as needed. 08/16/17   Lily Kocher, PA-C  naproxen (NAPROSYN) 500 MG tablet Take 1 tablet (500 mg total) by mouth 2 (two) times daily. 05/08/20   Noemi Chapel, MD  oxymetazoline (NASAL SPRAY 12 HOUR) 0.05 % nasal spray Place 1 spray into both nostrils 2 (two) times daily as needed for congestion.    [provider]  tetrahydrozoline (EYE DROPS) 0.05 % ophthalmic solution Place 1 drop into both eyes daily as needed (for irritation/dry eye relief).    [provider]                                                                                                                                    Past Surgical History History reviewed. No pertinent surgical history. Family History History reviewed. No pertinent family history.  Social History Social History   Tobacco Use   Smoking status: Former     Types: Cigarettes   Smokeless tobacco: Never  Substance Use Topics   Alcohol use: Yes   Drug use: Yes    Frequency: 1.0 times per week    Types: Marijuana   Allergies Patient has no known allergies.  Review of Systems Review of Systems  Respiratory:  Positive for chest tightness and shortness of breath.   Cardiovascular:  Positive for palpitations.  Psychiatric/Behavioral:  The patient is nervous/anxious.     Physical Exam Vital Signs  I have reviewed the triage vital signs BP (!) 169/91   Pulse (!) 165   Ht 5' 11"$  (1.803 m)   Wt 95.3 kg   SpO2 99%   BMI 29.30 kg/m   Physical Exam Vitals and nursing note reviewed.  Constitutional:      General: He is not in acute distress.    Appearance: Normal appearance. He is well-developed.  HENT:     Head: Normocephalic and atraumatic.  Nose: No congestion or rhinorrhea.  Eyes:     General:        Right eye: No discharge.        Left eye: No discharge.     Extraocular Movements: Extraocular movements intact.     Conjunctiva/sclera: Conjunctivae normal.     Pupils: Pupils are equal, round, and reactive to light.  Cardiovascular:     Rate and Rhythm: Regular rhythm. Tachycardia present.     Heart sounds: No murmur heard. Pulmonary:     Effort: Pulmonary effort is normal. No respiratory distress.     Breath sounds: No wheezing or rales.  Abdominal:     General: There is no distension.     Tenderness: There is no abdominal tenderness.  Musculoskeletal:        General: No swelling. Normal range of motion.     Cervical back: Normal range of motion and neck supple.  Skin:    General: Skin is warm and dry.  Neurological:     General: No focal deficit present.     Mental Status: He is alert.  Psychiatric:        Mood and Affect: Mood normal.     ED Results and Treatments Labs (all labs ordered are listed, but only abnormal results are displayed) Labs Reviewed  COMPREHENSIVE METABOLIC PANEL  CBC WITH  DIFFERENTIAL/PLATELET  TROPONIN I (HIGH SENSITIVITY)                                                                                                                          Radiology DG Chest Portable 1 View  Result Date: 07/16/2022 CLINICAL DATA:  Chest pain, shortness of breath EXAM: PORTABLE CHEST 1 VIEW COMPARISON:  08/16/2017 FINDINGS: Lower lung volumes and. Trachea midline. The heart size and mediastinal contours are within normal limits. Both lungs are clear. The visualized skeletal structures are unremarkable. IMPRESSION: No active disease. Electronically Signed   By: Jerilynn Mages.  Shick M.D.   On: 07/16/2022 15:10    Pertinent labs & imaging results that were available during my care of the patient were reviewed by me and considered in my medical decision making (see MDM for details).  Medications Ordered in ED Medications  lactated ringers bolus 1,000 mL (1,000 mLs Intravenous New Bag/Given 07/16/22 1453)  LORazepam (ATIVAN) injection 0.5 mg (0.5 mg Intravenous Given 07/16/22 1500)  LORazepam (ATIVAN) injection 0.5 mg (0.5 mg Intravenous Given 07/16/22 1528)  Procedures Procedures  (including critical care time)  Medical Decision Making / ED Course   This patient presents to the ED for concern of chest tightness, this involves an extensive number of treatment options, and is a complaint that carries with it a high risk of complications and morbidity.  The differential diagnosis includes anxiety, panic attack, SVT, dysrhythmia, a flutter, electrolyte abnormality, ACS, PE  MDM: Patient seen emergency room for evaluation of rapid heart rate and shortness of breath.  Physical exam reveals a very anxious appearing patient with a regular tachycardia with rates as high as 150.  Cardiopulmonary exam otherwise unremarkable.  Laboratory evaluation with a mild  hypokalemia to 3.1 but is otherwise unremarkable.  UDS obtained and is positive for THC.  Obtained to rule out amphetamine toxicity as the source of his anxiety and rapid heart rate.  Initial ECG showing sinus tachycardia in the setting of his known anxiety, we did trial Ativan.  Patient's heart rate significantly improved and dropped almost 30 points with the first dose of Ativan but patient still endorsing some mild chest tightness so we trialed an additional dose.  On my second reevaluation, patient's heart rates are hovering in the 80s.  At time of signout, patient pending reevaluation by oncoming provider.  Please see provider signout for continuation of workup.   Additional history obtained: -Additional history obtained from partner -External records from outside source obtained and reviewed including: Chart review including previous notes, labs, imaging, consultation notes   Lab Tests: -I ordered, reviewed, and interpreted labs.   The pertinent results include:   Labs Reviewed  COMPREHENSIVE METABOLIC PANEL  CBC WITH DIFFERENTIAL/PLATELET  TROPONIN I (HIGH SENSITIVITY)      EKG   EKG Interpretation  Date/Time:  Thursday July 16 2022 14:41:27 EST Ventricular Rate:  154 PR Interval:  113 QRS Duration: 85 QT Interval:  275 QTC Calculation: 441 R Axis:   71 Text Interpretation: Sinus tachycardia LAE, consider biatrial enlargement Baseline wander in lead(s) V5 V6 Since last tracing rate faster Confirmed by Isla Pence [phone]) on 07/16/2022 7:41:01 PM         Imaging Studies ordered: I ordered imaging studies including chest x-ray I independently visualized and interpreted imaging. I agree with the radiologist interpretation   Medicines ordered and prescription drug management: Meds ordered this encounter  Medications   DISCONTD: LORazepam (ATIVAN) injection 1 mg   lactated ringers bolus 1,000 mL   LORazepam (ATIVAN) injection 0.5 mg   LORazepam (ATIVAN)  injection 0.5 mg    -I have reviewed the patients home medicines and have made adjustments as needed  Critical interventions none   Cardiac Monitoring: The patient was maintained on a cardiac monitor.  I personally viewed and interpreted the cardiac monitored which showed an underlying rhythm of: Sinus tachycardia  Social Determinants of Health:  Factors impacting patients care include: Marijuana use   Reevaluation: After the interventions noted above, I reevaluated the patient and found that they have :improved  Co morbidities that complicate the patient evaluation History reviewed. No pertinent past medical history.    Dispostion: I considered admission for this patient, and disposition pending reevaluation by oncoming provider.  Please see provider signout for continuation of workup.     Final Clinical Impression(s) / ED Diagnoses Final diagnoses:  None     @PCDICTATION$ @    Teressa Lower, MD 07/16/22 2042

## 2022-07-16 NOTE — ED Notes (Signed)
Pt does c/o sob. Denies dizziness. Pt slightly gray in color but non diaphoretic. A/o. No resp distress or sob noted.

## 2023-06-08 ENCOUNTER — Encounter (HOSPITAL_COMMUNITY): Payer: Self-pay

## 2023-06-08 ENCOUNTER — Emergency Department (HOSPITAL_COMMUNITY)
Admission: EM | Admit: 2023-06-08 | Discharge: 2023-06-08 | Disposition: A | Discharge status: Home / Self Care | Payer: Self-pay | Attending: Emergency Medicine | Admitting: Emergency Medicine

## 2023-06-08 ENCOUNTER — Emergency Department (HOSPITAL_COMMUNITY): Payer: Self-pay

## 2023-06-08 DIAGNOSIS — R0602 Shortness of breath: Secondary | ICD-10-CM | POA: Insufficient documentation

## 2023-06-08 DIAGNOSIS — R110 Nausea: Secondary | ICD-10-CM | POA: Insufficient documentation

## 2023-06-08 DIAGNOSIS — D72829 Elevated white blood cell count, unspecified: Secondary | ICD-10-CM | POA: Insufficient documentation

## 2023-06-08 DIAGNOSIS — R0789 Other chest pain: Secondary | ICD-10-CM

## 2023-06-08 LAB — TROPONIN I (HIGH SENSITIVITY): Troponin I (High Sensitivity): <2 ng/L (ref <18)

## 2023-06-08 LAB — BASIC METABOLIC PANEL
Anion gap: 10 (ref 5–15)
BUN: 15 mg/dL (ref 6–20)
CO2: 20 mmol/L — ABNORMAL LOW (ref 22–32)
Calcium: 9.5 mg/dL (ref 8.9–10.3)
Chloride: 103 mmol/L (ref 98–111)
Creatinine, Ser: 0.9 mg/dL (ref 0.61–1.24)
GFR, Estimated: >60 mL/min (ref >60)
Glucose, Bld: 103 mg/dL — ABNORMAL HIGH (ref 70–99)
Potassium: 3.5 mmol/L (ref 3.5–5.1)
Sodium: 133 mmol/L — ABNORMAL LOW (ref 135–145)

## 2023-06-08 LAB — CBC
HCT: 51.5 % (ref 39.0–52.0)
Hemoglobin: 18.9 g/dL — ABNORMAL HIGH (ref 13.0–17.0)
MCH: 32 pg (ref 26.0–34.0)
MCHC: 36.7 g/dL — ABNORMAL HIGH (ref 30.0–36.0)
MCV: 87.1 fL (ref 80.0–100.0)
Platelets: 345 10*3/uL (ref 150–400)
RBC: 5.91 MIL/uL — ABNORMAL HIGH (ref 4.22–5.81)
RDW: 11.6 % (ref 11.5–15.5)
WBC: 11.5 10*3/uL — ABNORMAL HIGH (ref 4.0–10.5)
nRBC: 0 % (ref 0.0–0.2)

## 2023-06-08 LAB — D-DIMER, QUANTITATIVE: D-Dimer, Quant: <0.27 ug{FEU}/mL (ref 0.00–0.50)

## 2023-06-08 MED ORDER — LIDOCAINE VISCOUS HCL 2 % MT SOLN
15.0000 mL | Freq: Once | OROMUCOSAL | Status: AC
  Administered 2023-06-08: 15 mL via ORAL
  Filled 2023-06-08: qty 15

## 2023-06-08 MED ORDER — ACETAMINOPHEN 500 MG PO TABS
1000.0000 mg | ORAL_TABLET | Freq: Once | ORAL | Status: AC
  Administered 2023-06-08: 1000 mg via ORAL
  Filled 2023-06-08: qty 2

## 2023-06-08 MED ORDER — LORAZEPAM 1 MG PO TABS
1.0000 mg | ORAL_TABLET | Freq: Once | ORAL | Status: AC
  Administered 2023-06-08: 1 mg via ORAL
  Filled 2023-06-08: qty 1

## 2023-06-08 MED ORDER — ALUM & MAG HYDROXIDE-SIMETH 200-200-20 MG/5ML PO SUSP
30.0000 mL | Freq: Once | ORAL | Status: AC
  Administered 2023-06-08: 30 mL via ORAL
  Filled 2023-06-08: qty 30

## 2023-06-08 NOTE — ED Triage Notes (Signed)
 Pt c/o L chest discomfort, SOB, and nausea x2 days.  Pain score 8/10.  Pt has not taken anything for symptoms.  Pt sts "I can feel my heartbeat and it hurts."   Pt reports similar symptoms previously and was told it was stress related.

## 2023-06-08 NOTE — ED Provider Notes (Signed)
 [place] EMERGENCY DEPARTMENT AT [facility] Provider Note   CSN: [id number] Arrival date & time: 06/08/23  1616     History  Chief Complaint  Patient presents with   Chest Pain   Nausea   Shortness of Breath    Mitchell Russell is a 33 y.o. male with no significant past medical history presents with concern for left chest pain, shortness of breath, nausea past 2 days.  States he can feel his heartbeat.  Pain is nonpleuritic, nonexertional.  Does not radiate to the back.  He has not tried anything at home for the pain.  Denies any fevers, chills, cough.  Denies any vomiting, jaw pain, arm pain, vomiting.   Chest Pain Associated symptoms: shortness of breath   Shortness of Breath Associated symptoms: chest pain        Home Medications Prior to Admission medications   Medication Sig Start Date End Date Taking? Authorizing Provider  diphenhydrAMINE (BENADRYL) 50 MG capsule Take 50 mg by mouth every 6 (six) hours as needed for allergies or sleep. Liquid 30 ml   Yes [provider]  Melatonin 5 MG CHEW Chew 2 tablets by mouth at bedtime as needed (sleep). Gummies   Yes [provider]      Allergies    Bee venom    Review of Systems   Review of Systems  Respiratory:  Positive for shortness of breath.   Cardiovascular:  Positive for chest pain.    Physical Exam Updated Vital Signs BP (!) 136/90   Pulse (!) 105   Temp 99.8 F (37.7 C) (Oral)   Resp (!) 23   Ht 5' 10 (1.778 m)   Wt 90.7 kg   SpO2 96%   BMI 28.70 kg/m  Physical Exam Vitals and nursing note reviewed.  Constitutional:      General: He is not in acute distress.    Appearance: He is well-developed.     Comments: Well-appearing, but seems slightly anxious  HENT:     Head: Normocephalic and atraumatic.  Eyes:     Conjunctiva/sclera: Conjunctivae normal.  Cardiovascular:     Rate and Rhythm: Normal rate and regular rhythm.     Heart sounds: No murmur heard.     Comments: Heart rate within normal limits, then jumps up when I walk into the room.  Radial pulses 2+ bilaterally Dorsalis pedis pulses 2+ bilaterally Pulmonary:     Effort: Pulmonary effort is normal. No respiratory distress.     Breath sounds: Normal breath sounds.  Abdominal:     General: Abdomen is flat.     Palpations: Abdomen is soft.     Tenderness: There is no abdominal tenderness.  Musculoskeletal:        General: No swelling.     Cervical back: Neck supple.     Comments: Upper left chest wall tender palpation  Skin:    General: Skin is warm and dry.     Capillary Refill: Capillary refill takes less than 2 seconds.  Neurological:     Mental Status: He is alert.  Psychiatric:        Mood and Affect: Mood normal.     ED Results / Procedures / Treatments   Labs (all labs ordered are listed, but only abnormal results are displayed) Labs Reviewed  BASIC METABOLIC PANEL - Abnormal; Notable for the following components:      Result Value   Sodium 133 (*)    CO2 20 (*)  Glucose, Bld 103 (*)    All other components within normal limits  CBC - Abnormal; Notable for the following components:   WBC 11.5 (*)    RBC 5.91 (*)    Hemoglobin 18.9 (*)    MCHC 36.7 (*)    All other components within normal limits  D-DIMER, QUANTITATIVE  TROPONIN I (HIGH SENSITIVITY)    EKG EKG Interpretation Date/Time:  Tuesday June 08 2023 16:22:25 EST Ventricular Rate:  136 PR Interval:  112 QRS Duration:  80 QT Interval:  290 QTC Calculation: 436 R Axis:   77  Text Interpretation: Sinus tachycardia Cannot rule out Inferior infarct , age undetermined Abnormal ECG Confirmed by Yolande Charleston [phone]) on 06/08/2023 5:41:22 PM  Radiology DG Chest 2 View Result Date: 06/08/2023 CLINICAL DATA:  chest pain EXAM: CHEST - 2 VIEW COMPARISON:  07/16/2022 FINDINGS: Linear scarring or atelectasis in the right middle lobe. Lungs otherwise clear. Heart size and mediastinal contours are within  normal limits. No effusion. Visualized bones unremarkable. IMPRESSION: Right middle lobe scarring or atelectasis. Electronically Signed   By: JONETTA Faes M.D.   On: 06/08/2023 17:03    Procedures Procedures    Medications Ordered in ED Medications  acetaminophen  (TYLENOL ) tablet 1,000 mg (1,000 mg Oral Given 06/08/23 1850)  LORazepam  (ATIVAN ) tablet 1 mg (1 mg Oral Given 06/08/23 1850)  alum & mag hydroxide-simeth (MAALOX/MYLANTA) 200-200-20 MG/5ML suspension 30 mL (30 mLs Oral Given 06/08/23 1850)    And  lidocaine  (XYLOCAINE ) 2 % viscous mouth solution 15 mL (15 mLs Oral Given 06/08/23 1850)    ED Course/ Medical Decision Making/ A&P             HEART Score: 0                    Medical Decision Making Amount and/or Complexity of Data Reviewed Labs: ordered. Radiology: ordered.  Risk OTC drugs. Prescription drug management.     Differential diagnosis includes but is not limited to ACS, arrhythmia, pneumonia, musculoskeletal pain   ED Course:  Patient well-appearing, stable vitals signs.  He does appear slightly anxious.  Heart rate within normal limits outside of him, then jumps up when I am talking to him.  95% on room air, able to talk in full sentences without difficulty.  Left chest wall is tender to palpation.  Suspect this likely is musculoskeletal with a component of anxiety. Initial EKG with tachycardia.  However, patient continued on cardiac monitor which shows normal sinus rhythm.  EKG with no signs of ischemia.  Initial troponin under 2, heart score of 0, pain ongoing for 2 days and non-exertional, low concern for ACS at this time. Chest x-ray negative for any acute abnormalities.  CBC with slight leukocytosis at 11.5.  BMP unremarkable D-dimer within normal limits, no lower extremity swelling or pain, no concern for DVT or PE at this time. Upon re-evaluation, patient remains well-appearing, stable vital signs. Low concern for any emergent etiology at this time. He was  given Maalox, acetaminophen , and Ativan  to help with his symptoms.  Patient stable appropriate for discharge home at this time.  Impression: Atypical chest pain  Disposition:  The patient was discharged home with instructions to follow-up with PCP if symptoms not improve in the next 3 days. Return precautions given.  Cardiac Monitoring: / EKG: The patient was maintained on a cardiac monitor.  I personally viewed and interpreted the cardiac monitored which showed an underlying rhythm of: Normal sinus rhythm on  cardiac monitor              Final Clinical Impression(s) / ED Diagnoses Final diagnoses:  Atypical chest pain    Rx / DC Orders ED Discharge Orders     None         Veta Palma, DEVONNA 06/08/23 1927    Yolande Lamar BROCKS, MD 06/15/23 1148

## 2023-06-08 NOTE — Discharge Instructions (Addendum)
 Your workup today is reassuring. It is very unlikely that your pain is due to an issue in your heart.  Your cardiac enzyme (troponin) was normal today. Your EKG which is a measure of the heart's electrical activity and rhythm is normal today. These would both show abnormalities if you were having a heart attack.  Your chest x-ray is normal today.  You have no signs of a blood clot.  You may take up to 1000mg  of tylenol  every 6 hours as needed for pain.  Do not take more then 4g per day.  You may use up to 800mg  ibuprofen every 8 hours as needed for pain.  Do not exceed 2.4g of ibuprofen per day.  Please follow-up with your PCP within the next 3 days if symptoms not improving  Return to the ER if you have any shortness of breath to where you cannot talk in full sentences, difficulty breathing, worsening chest pain, dizziness, jaw pain, left arm or shoulder pain, abdominal pain, fever, any other new or concerning symptoms

## 2023-12-09 ENCOUNTER — Emergency Department (HOSPITAL_COMMUNITY)
Admission: EM | Admit: 2023-12-09 | Discharge: 2023-12-09 | Disposition: A | Discharge status: Home / Self Care | Attending: Emergency Medicine | Admitting: Emergency Medicine

## 2023-12-09 ENCOUNTER — Encounter (HOSPITAL_COMMUNITY): Payer: Self-pay

## 2023-12-09 ENCOUNTER — Other Ambulatory Visit: Payer: Self-pay

## 2023-12-09 ENCOUNTER — Emergency Department (HOSPITAL_COMMUNITY)

## 2023-12-09 DIAGNOSIS — R0682 Tachypnea, not elsewhere classified: Secondary | ICD-10-CM | POA: Insufficient documentation

## 2023-12-09 DIAGNOSIS — R079 Chest pain, unspecified: Secondary | ICD-10-CM | POA: Diagnosis not present

## 2023-12-09 DIAGNOSIS — R0789 Other chest pain: Secondary | ICD-10-CM | POA: Diagnosis not present

## 2023-12-09 LAB — CBC
HCT: 51.7 % (ref 39.0–52.0)
Hemoglobin: 18.3 g/dL — ABNORMAL HIGH (ref 13.0–17.0)
MCH: 31.8 pg (ref 26.0–34.0)
MCHC: 35.4 g/dL (ref 30.0–36.0)
MCV: 89.9 fL (ref 80.0–100.0)
Platelets: 347 K/uL (ref 150–400)
RBC: 5.75 MIL/uL (ref 4.22–5.81)
RDW: 11.8 % (ref 11.5–15.5)
WBC: 7.7 K/uL (ref 4.0–10.5)
nRBC: 0 % (ref 0.0–0.2)

## 2023-12-09 LAB — COMPREHENSIVE METABOLIC PANEL WITH GFR
ALT: 27 U/L (ref 0–44)
AST: 22 U/L (ref 15–41)
Albumin: 4.1 g/dL (ref 3.5–5.0)
Alkaline Phosphatase: 82 U/L (ref 38–126)
Anion gap: 13 (ref 5–15)
BUN: 18 mg/dL (ref 6–20)
CO2: 24 mmol/L (ref 22–32)
Calcium: 9.7 mg/dL (ref 8.9–10.3)
Chloride: 101 mmol/L (ref 98–111)
Creatinine, Ser: 0.92 mg/dL (ref 0.61–1.24)
GFR, Estimated: >60 mL/min (ref >60)
Glucose, Bld: 114 mg/dL — ABNORMAL HIGH (ref 70–99)
Potassium: 4.3 mmol/L (ref 3.5–5.1)
Sodium: 138 mmol/L (ref 135–145)
Total Bilirubin: 1 mg/dL (ref 0.0–1.2)
Total Protein: 7.6 g/dL (ref 6.5–8.1)

## 2023-12-09 LAB — TROPONIN I (HIGH SENSITIVITY)
Troponin I (High Sensitivity): <2 ng/L (ref <18)
Troponin I (High Sensitivity): <2 ng/L (ref <18)

## 2023-12-09 MED ORDER — COLCHICINE 0.6 MG PO TABS: 0.6000 mg | ORAL_TABLET | Freq: Every day | ORAL | 0 refills | Status: DC

## 2023-12-09 NOTE — ED Provider Notes (Signed)
 [place] EMERGENCY DEPARTMENT AT [facility] Provider Note   CSN: [id number] Arrival date & time: 12/09/23  1343     Patient presents with: Pleurisy   Mitchell Russell is a 33 y.o. male.   HPI   This is a 33 year old male with a history of some anxiety presenting with 1 year of chest pain which seems to be more intense over the last couple of days, it is somewhat positional when he leans to the right or leans forward, it is not associated with coughing fevers or swelling of the legs.  The patient has no history of coronary disease, he was referred to a psychiatrist because of the recurrent symptoms and tried an antianxiety medication which really did not help that much.  No other significant changes today other than some increase in intensity of the symptoms  Prior to Admission medications   Medication Sig Start Date End Date Taking? Authorizing Provider  colchicine  0.6 MG tablet Take 1 tablet (0.6 mg total) by mouth daily for 14 days. 12/09/23 12/23/23 Yes Cleotilde Rogue, MD  diphenhydrAMINE (BENADRYL) 50 MG capsule Take 50 mg by mouth every 6 (six) hours as needed for allergies or sleep. Liquid 30 ml    [provider]  hydrOXYzine (VISTARIL) 25 MG capsule Take 25 mg by mouth daily as needed for anxiety or itching. 09/06/23   [provider]  Melatonin 5 MG CHEW Chew 2 tablets by mouth at bedtime as needed (sleep). Gummies    [provider]    Allergies: Bee venom    Review of Systems  All other systems reviewed and are negative.   Updated Vital Signs BP (!) 142/101 (BP Location: Right Arm)   Pulse (!) 133   Temp 98.8 F (37.1 C) (Temporal)   Resp 18   Ht 1.778 m (5' 10)   Wt 93 kg   SpO2 98%   BMI 29.41 kg/m   Physical Exam Vitals and nursing note reviewed.  Constitutional:      General: He is not in acute distress.    Appearance: He is well-developed.  HENT:     Head: Normocephalic and atraumatic.     Mouth/Throat:      Pharynx: No oropharyngeal exudate.  Eyes:     General: No scleral icterus.       Right eye: No discharge.        Left eye: No discharge.     Conjunctiva/sclera: Conjunctivae normal.     Pupils: Pupils are equal, round, and reactive to light.  Neck:     Thyroid: No thyromegaly.     Vascular: No JVD.  Cardiovascular:     Rate and Rhythm: Normal rate and regular rhythm.     Heart sounds: Normal heart sounds. No murmur heard.    No friction rub. No gallop.  Pulmonary:     Effort: Pulmonary effort is normal. No respiratory distress.     Breath sounds: Normal breath sounds. No wheezing or rales.  Abdominal:     General: Bowel sounds are normal. There is no distension.     Palpations: Abdomen is soft. There is no mass.     Tenderness: There is no abdominal tenderness.  Musculoskeletal:        General: No tenderness. Normal range of motion.     Cervical back: Normal range of motion and neck supple.     Right lower leg: No edema.     Left lower leg: No edema.  Lymphadenopathy:  Cervical: No cervical adenopathy.  Skin:    General: Skin is warm and dry.     Findings: No erythema or rash.  Neurological:     Mental Status: He is alert.     Coordination: Coordination normal.  Psychiatric:        Behavior: Behavior normal.     (all labs ordered are listed, but only abnormal results are displayed) Labs Reviewed  CBC - Abnormal; Notable for the following components:      Result Value   Hemoglobin 18.3 (*)    All other components within normal limits  COMPREHENSIVE METABOLIC PANEL WITH GFR - Abnormal; Notable for the following components:   Glucose, Bld 114 (*)    All other components within normal limits  TROPONIN I (HIGH SENSITIVITY)  TROPONIN I (HIGH SENSITIVITY)    EKG: EKG Interpretation Date/Time:  Thursday December 09 2023 14:22:00 EDT Ventricular Rate:  121 PR Interval:  122 QRS Duration:  82 QT Interval:  304 QTC Calculation: 431 R Axis:   53  Text  Interpretation: Sinus tachycardia Otherwise normal ECG When compared with ECG of 08-Jun-2023 16:22, No significant change was found Confirmed by Cleotilde Rogue ([id number]) on 12/09/2023 7:25:09 PM  Radiology: DG Chest 2 View Result Date: 12/09/2023 CLINICAL DATA:  Chest pain. EXAM: CHEST - 2 VIEW COMPARISON:  June 08, 2023. FINDINGS: The heart size and mediastinal contours are within normal limits. Both lungs are clear. The visualized skeletal structures are unremarkable. IMPRESSION: No active cardiopulmonary disease. Electronically Signed   By: Lynwood Landy Raddle M.D.   On: 12/09/2023 14:40     Procedures   Medications Ordered in the ED - No data to display                                  Medical Decision Making Amount and/or Complexity of Data Reviewed Labs: ordered. Radiology: ordered.  Risk Prescription drug management.   The patient's EKG and chest x-ray are completely unremarkable, his exam is remarkable only for some mild tachypnea when he starts to get excited about his condition.  His heart rate goes from 70-110 in the room when I walked in, there does clearly appear to have a tachycardic response to his anxiety.  There is no signs of ischemia, normal troponin, labs are unremarkable, the patient is stable for discharge, ambulatory referral to cardiology given.  Doubt PE, doubt dissection, doubt pneumothorax, x-ray viewed and no signs of acute abnormalities.     Final diagnoses:  Left-sided chest pain    ED Discharge Orders          Ordered    colchicine  0.6 MG tablet  Daily        12/09/23 1937    Ambulatory referral to Cardiology        12/09/23 ROANN               Cleotilde Rogue, MD 12/09/23 1940

## 2023-12-09 NOTE — ED Triage Notes (Signed)
 Pt reports he chewed up 4 baby aspirin apprx 1 hr PTA w/o relief of his symptoms.

## 2023-12-09 NOTE — ED Triage Notes (Signed)
 Pt arrived via POV c/o constant chest pressure, originally reports it was abdominal pain, but now pain is in his chest. Pt reports being seen previously for same and was told it was his anxiety. Pt reports he has no medical history and does not take medication for anything. Pt reports being under a lot of stress due to his wife being pregnant and having a little child at home already.

## 2023-12-09 NOTE — ED Notes (Signed)
Ambulatory to tx room  

## 2023-12-09 NOTE — Discharge Instructions (Addendum)
 Your testing has been normal, no signs of heart attack, I do want you to take colchicine  exactly as prescribed once a day for the next couple of weeks while you are awaiting a cardiology follow-up visit.  Return to the emergency department immediately for severe or worsening symptoms

## 2024-03-09 NOTE — Progress Notes (Signed)
 CARDIOLOGY CONSULT NOTE       Patient ID: Mitchell Russell MRN: [id number] DOB/AGE: 31/01/1991 31 y.o.  Referring Physician: Redell Pinal [facility] ED Primary Physician: Patient, [name] Primary Cardiologist: new Reason for Consultation: Chest pain   HPI:  33 y.o. referred by [facility] ED for chest pain. History of anxiety. SSCP over a year worse July. Seen in ED 12/09/23 for Positional pain worse leaning to right or forward. Anti anxiety med and BH visit not helpful. R/O ECG ST no acute ischemic changes he was given colchicine  0.6 mg daily and d/c home CXR with NAD. Similar ER visit 07/16/22 with no PE or aortic pathology  Since ER complains of daily muscular pain on left side of chest. Can wake with it. Takes Tylenol  and NSAI's with some help Pain can be positional but not pleuritic.   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 Married with 67 yo son. Father was an alcoholic with heart problems and died 4 years ago   ROS All other systems reviewed and negative except as noted above  History reviewed. No pertinent past medical history.  Family History  Problem Relation Age of Onset   Diabetes Mother    Hypertension Mother    Diabetes Father    Hypertension Father    Heart disease Father     Social History   Socioeconomic History   Marital status: Married    Spouse name: Not on file   Number of children: Not on file   Years of education: Not on file   Highest education level: Not on file  Occupational History   Not on file  Tobacco Use   Smoking status: Former    Types: Cigarettes    Passive exposure: Past   Smokeless tobacco: Never  Vaping Use   Vaping status: Never Used  Substance and Sexual Activity   Alcohol use: Yes   Drug use: Yes    Frequency: 1.0 times per week    Types: Marijuana   Sexual activity: Not on file  Other Topics Concern   Not on file  Social History Narrative   Not on file   Social Drivers of Health   Financial Resource Strain: Not on file   Food Insecurity: Not on file  Transportation Needs: Not on file  Physical Activity: Not on file  Stress: Not on file  Social Connections: Not on file  Intimate Partner Violence: Not on file    History reviewed. No pertinent surgical history.    Current Outpatient Medications:    diphenhydrAMINE (BENADRYL) 50 MG capsule, Take 50 mg by mouth every 6 (six) hours as needed for allergies or sleep. Liquid 30 ml, Disp: , Rfl:    Melatonin 5 MG CHEW, Chew 2 tablets by mouth at bedtime as needed (sleep). Gummies, Disp: , Rfl:     Physical Exam: Blood pressure 126/82, pulse 88, height 5' 10 (1.778 m), weight 228 lb (103.4 kg), SpO2 98%.   Affect appropriate Healthy:  appears stated age HEENT: normal Neck supple with no adenopathy JVP normal no bruits no thyromegaly Lungs clear with no wheezing and good diaphragmatic motion Heart:  S1/S2 no murmur, no rub, gallop or click PMI normal Abdomen: benighn, BS positve, no tenderness, no AAA no bruit.  No HSM or HJR Distal pulses intact with no bruits No edema Neuro non-focal Skin warm and dry No muscular weakness   Labs:   Lab Results  Component Value Date   WBC 7.7 12/09/2023  HGB 18.3 (H) 12/09/2023   HCT 51.7 12/09/2023   MCV 89.9 12/09/2023   PLT 347 12/09/2023      Radiology: No results found.  EKG:  12/10/23 ST rate 121 normal otherwise    ASSESSMENT AND PLAN:   Chest pain: Atypical check ESR/CRP.  Shared decision making favor cardiac CTA and echo to further risk stratify. CT can check for sternal/rib abnormalities Tietz's syndrome as well as pericardium and anomalous coronary arteries. Echo to r/o structural heart dx Anxiety:  should have f/u with [facility]  Cardiac CTA Lopressor 50 mg night before and 100 mg 2 hours before Echo ESR/CRP  F/U PRN  Signed: Maude Emmer 03/17/2024, 9:33 AM

## 2024-03-17 ENCOUNTER — Ambulatory Visit: Payer: Self-pay | Admitting: Cardiovascular Disease

## 2024-03-17 ENCOUNTER — Other Ambulatory Visit (HOSPITAL_COMMUNITY)
Admission: RE | Admit: 2024-03-17 | Discharge: 2024-03-17 | Disposition: A | Discharge status: Home / Self Care | Source: Ambulatory Visit | Attending: Cardiovascular Disease | Admitting: Cardiovascular Disease

## 2024-03-17 ENCOUNTER — Ambulatory Visit: Admitting: Cardiovascular Disease

## 2024-03-17 ENCOUNTER — Encounter: Payer: Self-pay | Admitting: Cardiovascular Disease

## 2024-03-17 VITALS — BP 126/82 | HR 88 | Ht 70.0 in | Wt 228.0 lb

## 2024-03-17 DIAGNOSIS — R079 Chest pain, unspecified: Secondary | ICD-10-CM | POA: Insufficient documentation

## 2024-03-17 DIAGNOSIS — F411 Generalized anxiety disorder: Secondary | ICD-10-CM

## 2024-03-17 LAB — BASIC METABOLIC PANEL WITH GFR
Anion gap: 10 (ref 5–15)
BUN: 13 mg/dL (ref 6–20)
CO2: 27 mmol/L (ref 22–32)
Calcium: 9.4 mg/dL (ref 8.9–10.3)
Chloride: 99 mmol/L (ref 98–111)
Creatinine, Ser: 1.04 mg/dL (ref 0.61–1.24)
GFR, Estimated: >60 mL/min (ref >60)
Glucose, Bld: 108 mg/dL — ABNORMAL HIGH (ref 70–99)
Potassium: 4.5 mmol/L (ref 3.5–5.1)
Sodium: 137 mmol/L (ref 135–145)

## 2024-03-17 LAB — C-REACTIVE PROTEIN: CRP: 2 mg/dL — ABNORMAL HIGH (ref <1.0)

## 2024-03-17 LAB — SEDIMENTATION RATE: Sed Rate: 1 mm/h (ref 0–15)

## 2024-03-17 MED ORDER — METOPROLOL TARTRATE 50 MG PO TABS: ORAL_TABLET | ORAL | 0 refills | Status: AC

## 2024-03-17 NOTE — Addendum Note (Signed)
 Addended by: KENETH KNEE C on: 03/17/2024 10:00 AM   Modules accepted: Orders

## 2024-03-17 NOTE — Patient Instructions (Addendum)
 Medication Instructions:    Take Lopressor 50 mg (1 tablet) the night before CT and 100 mg (2 tablets) 2 hours before CT  Labwork: BMET,ESR,CRP today   Your physician has requested that you have an echocardiogram. Echocardiography is a painless test that uses sound waves to create images of your heart. It provides your doctor with information about the size and shape of your heart and how well your heart's chambers and valves are working. This procedure takes approximately one hour. There are no restrictions for this procedure. Please do NOT wear cologne, perfume, aftershave, or lotions (deodorant is allowed). Please arrive 15 minutes prior to your appointment time.  Please note: We ask at that you not bring children with you during ultrasound (echo/ vascular) testing. Due to room size and safety concerns, children are not allowed in the ultrasound rooms during exams. Our front office staff cannot provide observation of children in our lobby area while testing is being conducted. An adult accompanying a patient to their appointment will only be allowed in the ultrasound room at the discretion of the ultrasound technician under special circumstances. We apologize for any inconvenience.   Follow-Up: As needed  Any Other Special Instructions Will Be Listed Below (If Applicable).       Your cardiac CT will be scheduled at one of the below locations:   [facility] [address] [place], [place] [address] [phone] (Severe contrast allergies only)  OR   [facility] [address] [place], [place] [address] [phone]  OR   [facility] [address] [place], [place] [address] [phone]  OR   [facility] and Vascular Tower [address]  [place], [place] [address] [phone]  OR   [facility] [address] [place], [place] [phone]  If scheduled at [facility], please  arrive at the [facility] and Children's Entrance (Entrance C2) of [facility] 30 minutes prior to test start time. You can use the FREE valet parking offered at entrance C (encouraged to control the heart rate for the test)  Proceed to the [facility] Radiology Department (first floor) to check-in and test prep.  All radiology patients and guests should use entrance C2 at [facility] accessed from [facility], even though the hospital's physical address listed is [address]  If scheduled at the Heart and Vascular Tower at [address], please enter the parking lot using the [address] entrance and use the FREE valet service at the patient drop-off area. Enter the building and check-in with registration on the main floor.  If scheduled at [facility], please arrive to the Heart and Vascular Center 15 mins early for check-in and test prep.  There is spacious parking and easy access to the radiology department from the [facility] Heart and Vascular entrance. Please enter here and check-in with the desk attendant.   If scheduled at [facility], please arrive 30 minutes early for check-in and test prep.  Please follow these instructions carefully (unless otherwise directed):  An IV will be required for this test and Nitroglycerin will be given.  Hold all erectile dysfunction medications at least 3 days (72 hrs) prior to test. (Ie viagra, cialis, sildenafil, tadalafil, etc)   On the Night Before the Test: Be sure to Drink plenty of water. Do not consume any caffeinated/decaffeinated beverages or chocolate 12 hours prior to your test. Do not take  any antihistamines 12 hours prior to your test.   On the Day of the Test: Drink plenty of water until 1 hour prior to the test. Do not eat any food 1 hour prior to test. You may take your regular medications prior to the test.  Take metoprolol (Lopressor) two hours prior to test. If  you take Furosemide/Hydrochlorothiazide/Spironolactone/Chlorthalidone, please HOLD on the morning of the test. Patients who wear a continuous glucose monitor MUST remove the device prior to scanning. After receiving IV contrast, you may experience a mild flushed feeling. This is normal. On occasion, you may experience a mild rash up to 24 hours after the test. This is not dangerous. If this occurs, you can take Benadryl 25 mg, Zyrtec, Claritin, or Allegra and increase your fluid intake. (Patients taking Tikosyn should avoid Benadryl, and may take Zyrtec, Claritin, or Allegra) If you experience trouble breathing, this can be serious. If it is severe call 911 IMMEDIATELY. If it is mild, please call our office.  We will call to schedule your test 2-4 weeks out understanding that some insurance companies will need an authorization prior to the service being performed.   For more information and frequently asked questions, please visit our website : [email or web address]  For non-scheduling related questions, please contact the cardiac imaging nurse navigator should you have any questions/concerns: Cardiac Imaging Nurse Navigators Direct Office Dial: [phone]   For scheduling needs, including cancellations and rescheduling, please call [place], [phone].

## 2024-03-24 ENCOUNTER — Telehealth: Payer: Self-pay | Admitting: Cardiovascular Disease

## 2024-03-24 NOTE — Telephone Encounter (Signed)
 Pt was given some medication at last visit to take with testing and they are not sure which test to take the medication with. Please advise

## 2024-03-24 NOTE — Telephone Encounter (Signed)
 Cardiac CTA Lopressor 50 mg night before and 100 mg 2 hours before Per 03/17/2024 Dr Maude Emmer office visit note.   OK to left message on VM per DPR. Left detailed message of instructions and when to take Lopressor the night before and 2 hours before his CT.  Requested he call back if any further questions or concerns.

## 2024-03-28 ENCOUNTER — Ambulatory Visit: Attending: Cardiovascular Disease

## 2024-03-28 DIAGNOSIS — R079 Chest pain, unspecified: Secondary | ICD-10-CM | POA: Insufficient documentation

## 2024-03-28 LAB — ECHOCARDIOGRAM COMPLETE
AR max vel: 3.9 cm2
AV Peak grad: 6.2 mmHg
Ao pk vel: 1.24 m/s
Area-P 1/2: 3.89 cm2
Calc EF: 71.2 %
S' Lateral: 2 cm
Single Plane A2C EF: 72 %
Single Plane A4C EF: 71.4 %

## 2024-03-30 ENCOUNTER — Encounter (HOSPITAL_COMMUNITY): Payer: Self-pay

## 2024-04-03 ENCOUNTER — Ambulatory Visit (HOSPITAL_COMMUNITY)
Admission: RE | Admit: 2024-04-03 | Discharge: 2024-04-03 | Disposition: A | Discharge status: Home / Self Care | Source: Ambulatory Visit | Attending: Cardiovascular Disease | Admitting: Cardiovascular Disease

## 2024-04-03 DIAGNOSIS — R079 Chest pain, unspecified: Secondary | ICD-10-CM | POA: Insufficient documentation

## 2024-04-03 MED ORDER — NITROGLYCERIN 0.4 MG SL SUBL
0.8000 mg | SUBLINGUAL_TABLET | Freq: Once | SUBLINGUAL | Status: AC
  Administered 2024-04-03: 0.8 mg via SUBLINGUAL

## 2024-04-03 MED ORDER — IOHEXOL 350 MG/ML SOLN
100.0000 mL | Freq: Once | INTRAVENOUS | Status: AC | PRN
  Administered 2024-04-03: 100 mL via INTRAVENOUS

## 2024-04-13 ENCOUNTER — Emergency Department (HOSPITAL_COMMUNITY)
Admission: EM | Admit: 2024-04-13 | Discharge: 2024-04-13 | Disposition: A | Discharge status: Home / Self Care | Source: Ambulatory Visit | Attending: Emergency Medicine | Admitting: Emergency Medicine

## 2024-04-13 ENCOUNTER — Other Ambulatory Visit: Payer: Self-pay

## 2024-04-13 ENCOUNTER — Emergency Department (HOSPITAL_COMMUNITY)

## 2024-04-13 ENCOUNTER — Encounter (HOSPITAL_COMMUNITY): Payer: Self-pay | Admitting: Emergency Medicine

## 2024-04-13 ENCOUNTER — Ambulatory Visit
Admission: EM | Admit: 2024-04-13 | Discharge: 2024-04-13 | Disposition: A | Discharge status: Home / Self Care | Attending: Nurse Practitioner | Admitting: Nurse Practitioner

## 2024-04-13 DIAGNOSIS — R0789 Other chest pain: Secondary | ICD-10-CM | POA: Diagnosis not present

## 2024-04-13 DIAGNOSIS — R002 Palpitations: Secondary | ICD-10-CM | POA: Diagnosis not present

## 2024-04-13 DIAGNOSIS — R079 Chest pain, unspecified: Secondary | ICD-10-CM | POA: Diagnosis not present

## 2024-04-13 DIAGNOSIS — Z79899 Other long term (current) drug therapy: Secondary | ICD-10-CM | POA: Insufficient documentation

## 2024-04-13 DIAGNOSIS — R0989 Other specified symptoms and signs involving the circulatory and respiratory systems: Secondary | ICD-10-CM | POA: Diagnosis not present

## 2024-04-13 LAB — BASIC METABOLIC PANEL WITH GFR
Anion gap: 13 (ref 5–15)
BUN: 13 mg/dL (ref 6–20)
CO2: 22 mmol/L (ref 22–32)
Calcium: 9.3 mg/dL (ref 8.9–10.3)
Chloride: 103 mmol/L (ref 98–111)
Creatinine, Ser: 0.85 mg/dL (ref 0.61–1.24)
GFR, Estimated: >60 mL/min (ref >60)
Glucose, Bld: 103 mg/dL — ABNORMAL HIGH (ref 70–99)
Potassium: 4 mmol/L (ref 3.5–5.1)
Sodium: 138 mmol/L (ref 135–145)

## 2024-04-13 LAB — CBC
HCT: 47 % (ref 39.0–52.0)
Hemoglobin: 17 g/dL (ref 13.0–17.0)
MCH: 31.1 pg (ref 26.0–34.0)
MCHC: 36.2 g/dL — ABNORMAL HIGH (ref 30.0–36.0)
MCV: 86.1 fL (ref 80.0–100.0)
Platelets: 293 K/uL (ref 150–400)
RBC: 5.46 MIL/uL (ref 4.22–5.81)
RDW: 11.9 % (ref 11.5–15.5)
WBC: 5.7 K/uL (ref 4.0–10.5)
nRBC: 0 % (ref 0.0–0.2)

## 2024-04-13 LAB — TROPONIN T, HIGH SENSITIVITY
Troponin T High Sensitivity: <15 ng/L (ref 0–19)
Troponin T High Sensitivity: <15 ng/L (ref 0–19)

## 2024-04-13 LAB — TSH: TSH: 1.34 u[IU]/mL (ref 0.350–4.500)

## 2024-04-13 LAB — D-DIMER, QUANTITATIVE: D-Dimer, Quant: <0.27 ug{FEU}/mL (ref 0.00–0.50)

## 2024-04-13 LAB — GLUCOSE, POCT (MANUAL RESULT ENTRY): POCT Glucose (KUC): 98 mg/dL (ref 70–99)

## 2024-04-13 LAB — MAGNESIUM: Magnesium: 2.1 mg/dL (ref 1.7–2.4)

## 2024-04-13 MED ORDER — NAPROXEN 500 MG PO TABS: 500.0000 mg | ORAL_TABLET | Freq: Two times a day (BID) | ORAL | 0 refills | Status: AC

## 2024-04-13 MED ORDER — SODIUM CHLORIDE 0.9 % IV BOLUS
1000.0000 mL | Freq: Once | INTRAVENOUS | Status: AC
  Administered 2024-04-13: 1000 mL via INTRAVENOUS

## 2024-04-13 MED ORDER — KETOROLAC TROMETHAMINE 15 MG/ML IJ SOLN
15.0000 mg | Freq: Once | INTRAMUSCULAR | Status: AC
  Administered 2024-04-13: 15 mg via INTRAVENOUS
  Filled 2024-04-13: qty 1

## 2024-04-13 NOTE — ED Notes (Signed)
 Patient is being discharged from the Urgent Care and sent to the Emergency Department via POV. Per Harlene Code, NP, patient is in need of higher level of care due to palpitations. Patient is aware and verbalizes understanding of plan of care.  Vitals:   04/13/24 0910  BP: 131/85  Pulse: 83  Resp: 18  Temp: 98.4 F (36.9 C)  SpO2: 97%

## 2024-04-13 NOTE — ED Provider Notes (Signed)
 [place] EMERGENCY DEPARTMENT AT [facility] Provider Note   CSN: [id number] Arrival date & time: 04/13/24  9040     Patient presents with: Chest Pain   Mitchell Russell is a 33 y.o. male.   Patient is a 33 year old male who presents to the emergency department with a chief complaint of palpitations and chest pain which began this morning.  He notes that he has been having ongoing and intermittent pain for approximate the past 6 months.  He has been evaluated by cardiology with unremarkable echo and CT cardiac study.  Patient notes that the pain does radiate to his left arm.  He denies any increased stress but does note that he has a 35-day-old infant at home.  He denies any abdominal pain, nausea, vomiting, diarrhea.  He has had no dizziness, lightheadedness or syncope.   Chest Pain      Prior to Admission medications   Medication Sig Start Date End Date Taking? Authorizing Provider  diphenhydrAMINE (BENADRYL) 50 MG capsule Take 50 mg by mouth every 6 (six) hours as needed for allergies or sleep. Liquid 30 ml    [provider]  Melatonin 5 MG CHEW Chew 2 tablets by mouth at bedtime as needed (sleep). Gummies    [provider]  metoprolol tartrate (LOPRESSOR) 50 MG tablet Take 50 mg (1 tablet) the night before test and 100 mg (2 tablets) 2 hours before CT 03/17/24   Delford Maude BROCKS, MD    Allergies: Bee venom    Review of Systems  Cardiovascular:  Positive for chest pain.  All other systems reviewed and are negative.   Updated Vital Signs BP (!) 143/87   Pulse 76   Temp 98.7 F (37.1 C) (Oral)   Resp 20   Ht 5' 10 (1.778 m)   Wt 102.1 kg   SpO2 98%   BMI 32.28 kg/m   Physical Exam Vitals and nursing note reviewed.  Constitutional:      General: He is not in acute distress.    Appearance: Normal appearance. He is not ill-appearing.  HENT:     Head: Normocephalic and atraumatic.     Nose: Nose normal.     Mouth/Throat:      Mouth: Mucous membranes are moist.  Eyes:     Extraocular Movements: Extraocular movements intact.     Conjunctiva/sclera: Conjunctivae normal.     Pupils: Pupils are equal, round, and reactive to light.  Cardiovascular:     Rate and Rhythm: Normal rate and regular rhythm.     Pulses: Normal pulses.     Heart sounds: Normal heart sounds. No murmur heard. Pulmonary:     Effort: Pulmonary effort is normal. No tachypnea.     Breath sounds: Normal breath sounds. No decreased breath sounds, wheezing, rhonchi or rales.  Chest:     Chest wall: Tenderness present.  Abdominal:     General: Abdomen is flat. Bowel sounds are normal.     Palpations: Abdomen is soft. There is no mass.     Tenderness: There is no guarding.  Musculoskeletal:        General: Normal range of motion.     Cervical back: Normal range of motion and neck supple.     Right lower leg: No edema.     Left lower leg: No edema.  Skin:    General: Skin is warm and dry.     Findings: No ecchymosis or rash.  Neurological:     General:  No focal deficit present.     Mental Status: He is alert and oriented to person, place, and time. Mental status is at baseline.     Cranial Nerves: No cranial nerve deficit.     Motor: No weakness.  Psychiatric:        Mood and Affect: Mood normal.        Behavior: Behavior normal.        Thought Content: Thought content normal.        Judgment: Judgment normal.     (all labs ordered are listed, but only abnormal results are displayed) Labs Reviewed  BASIC METABOLIC PANEL WITH GFR - Abnormal; Notable for the following components:      Result Value   Glucose, Bld 103 (*)    All other components within normal limits  CBC - Abnormal; Notable for the following components:   MCHC 36.2 (*)    All other components within normal limits  D-DIMER, QUANTITATIVE  TSH  MAGNESIUM  TROPONIN T, HIGH SENSITIVITY  TROPONIN T, HIGH SENSITIVITY    EKG: None  Radiology: [facility] Chest Port 1  View Result Date: 04/13/2024 CLINICAL DATA:  Chest pain EXAM: PORTABLE CHEST 1 VIEW COMPARISON:  Chest x-ray performed December 09, 2023 FINDINGS: Low lung volumes with bibasilar hypoventilatory changes. No focal consolidation, pleural effusion, or pneumothorax. IMPRESSION: No active disease. Electronically Signed   By: Maude Naegeli M.D.   On: 04/13/2024 11:16     Procedures   Medications Ordered in the ED  ketorolac  (TORADOL ) 15 MG/ML injection 15 mg (15 mg Intravenous Given 04/13/24 1123)  sodium chloride  0.9 % bolus 1,000 mL (1,000 mLs Intravenous New Bag/Given 04/13/24 1124)                                    Medical Decision Making Amount and/or Complexity of Data Reviewed Labs: ordered. Radiology: ordered.  Risk Prescription drug management.   This patient presents to the ED for concern of chest pain, palpitations differential diagnosis includes ACS, pulmonary embolus, pericarditis, myocarditis, endocarditis, aortic aneurysm or dissection, pneumonia, pneumothorax, hemothorax, thyrotoxicosis, thyroid storm, atrial fibrillation, arrhythmia, costochondritis    Additional history obtained:  Additional history obtained from medical records External records from outside source obtained and reviewed including medical records   Lab Tests:  I Ordered, and personally interpreted labs.  The pertinent results include: No leukocytosis, no anemia, normal kidney function, unremarkable electrolytes, normal magnesium, normal TSH, negative D-dimer, negative troponin   Imaging Studies ordered:  I ordered imaging studies including chest x-ray I independently visualized and interpreted imaging which showed no acute cardiopulmonary process I agree with the radiologist interpretation   Medicines ordered and prescription drug management:  I ordered medication including Toradol , IV fluids for atypical chest pain Reevaluation of the patient after these medicines showed that the patient  improved I have reviewed the patients home medicines and have made adjustments as needed   Problem List / ED Course:  Patient is doing well at this time and is stable for discharge home.  Symptoms have improved with treatment in the emergency department.  Pain is completely reproducible with palpation over the anterior chest wall.  Patient has no indication for ACS at this time.  He has EKG with no acute ischemic changes and negative serial troponins.  He has a negative D-dimer I do not suspect pulmonary bolus and is otherwise low risk individual.  Symptoms have not  been positional in nature and do not suspect pericarditis or myocarditis.  He has no indication for endocarditis or aortic aneurysm or dissection.  Discussed the need for close follow-up with his cardiologist for possible Zio patch.  Strict return precautions were discussed for any new or worsening symptoms.  Patient voiced understanding and had no additional questions.   Social Determinants of Health:  None        Final diagnoses:  None    ED Discharge Orders     None          Daralene Lonni JONETTA DEVONNA 04/13/24 1311    Suzette Pac, MD 04/14/24 [phone]

## 2024-04-13 NOTE — ED Provider Notes (Signed)
 [facility]    CSN: [id number] Arrival date & time: 04/13/24  0856      History   Chief Complaint Chief Complaint  Patient presents with   Palpitations    HPI Mitchell Russell is a 33 y.o. male.   Patient presents today with concern for chest pain and palpitations.  Reports he has been dealing with chest pain for the past 8 months and has recently seen a cardiologist.  Reports he woke up this morning from the chest pain and it was the worst it had ever been.  He also endorses palpitations and dizziness that he does not normally experience with the chest pain.  Reports since time has gone on, the pain has gotten better and is currently a 4 out of 10.  The pain is constant.  Reports that palpitations and dizziness are intermittent and not associated with position changes.  The dizziness is not a room spinning sensation, is not worse with head movement or bending over.  He denies recent viral illness.  He does report that the palpitations and chest pain make him a little bit anxious and make him feel short of breath and nauseous.  He denies diaphoresis.  Patient also reports he and his wife recently welcomed their second child who is 58 days old.    History reviewed. No pertinent past medical history.  There are no active problems to display for this patient.   History reviewed. No pertinent surgical history.     Home Medications    Prior to Admission medications   Medication Sig Start Date End Date Taking? Authorizing Provider  diphenhydrAMINE (BENADRYL) 50 MG capsule Take 50 mg by mouth every 6 (six) hours as needed for allergies or sleep. Liquid 30 ml    [provider]  Melatonin 5 MG CHEW Chew 2 tablets by mouth at bedtime as needed (sleep). Gummies    [provider]  metoprolol tartrate (LOPRESSOR) 50 MG tablet Take 50 mg (1 tablet) the night before test and 100 mg (2 tablets) 2 hours before CT 03/17/24   Delford Maude BROCKS, MD  naproxen   (NAPROSYN ) 500 MG tablet Take 1 tablet (500 mg total) by mouth 2 (two) times daily. 04/13/24   Daralene Lonni JONETTA, PA-C    Family History Family History  Problem Relation Age of Onset   Diabetes Mother    Hypertension Mother    Diabetes Father    Hypertension Father    Heart disease Father     Social History Social History   Tobacco Use   Smoking status: Former    Types: Cigarettes    Passive exposure: Past   Smokeless tobacco: Never  Vaping Use   Vaping status: Never Used  Substance Use Topics   Alcohol use: Not Currently   Drug use: Not Currently    Frequency: 1.0 times per week    Types: Marijuana     Allergies   Bee venom   Review of Systems Review of Systems Per HPI  Physical Exam Triage Vital Signs ED Triage Vitals  Encounter Vitals Group     BP 04/13/24 0910 131/85     Girls Systolic BP Percentile --      Girls Diastolic BP Percentile --      Boys Systolic BP Percentile --      Boys Diastolic BP Percentile --      Pulse Rate 04/13/24 0910 83     Resp 04/13/24 0910 18     Temp 04/13/24  0910 98.4 F (36.9 C)     Temp Source 04/13/24 0910 Oral     SpO2 04/13/24 0910 97 %     Weight --      Height --      Head Circumference --      Peak Flow --      Pain Score 04/13/24 0909 6     Pain Loc --      Pain Education --      Exclude from Growth Chart --    Orthostatic VS for the past 24 hrs:  BP- Lying Pulse- Lying BP- Sitting Pulse- Sitting BP- Standing at 0 minutes Pulse- Standing at 0 minutes  04/13/24 0936 132/84 69 (!) 141/99 75 134/89 84    Updated Vital Signs BP 131/85 (BP Location: Right Arm)   Pulse 83   Temp 98.4 F (36.9 C) (Oral)   Resp 18   SpO2 97%   Visual Acuity Right Eye Distance:   Left Eye Distance:   Bilateral Distance:    Right Eye Near:   Left Eye Near:    Bilateral Near:     Physical Exam Vitals and nursing note reviewed.  Constitutional:      General: He is not in acute distress.    Appearance: Normal  appearance. He is not ill-appearing, toxic-appearing or diaphoretic.  HENT:     Head: Normocephalic and atraumatic.     Right Ear: External ear normal.     Left Ear: External ear normal.     Mouth/Throat:     Mouth: Mucous membranes are moist.     Pharynx: Oropharynx is clear.  Eyes:     General:        Right eye: No discharge.        Left eye: No discharge.     Extraocular Movements: Extraocular movements intact.  Cardiovascular:     Rate and Rhythm: Normal rate and regular rhythm.     Pulses: Normal pulses.     Heart sounds: No murmur heard. Pulmonary:     Effort: Pulmonary effort is normal. No respiratory distress.     Breath sounds: Normal breath sounds. No wheezing, rhonchi or rales.  Chest:       Comments: Chest wall tenderness to palpation Musculoskeletal:        General: Normal range of motion.     Cervical back: Normal range of motion.     Right lower leg: No edema.     Left lower leg: No edema.  Lymphadenopathy:     Cervical: No cervical adenopathy.  Skin:    General: Skin is warm and dry.     Capillary Refill: Capillary refill takes less than 2 seconds.     Coloration: Skin is not jaundiced or pale.     Findings: No erythema.  Neurological:     Mental Status: He is alert and oriented to person, place, and time.  Psychiatric:        Behavior: Behavior is cooperative.      UC Treatments / Results  Labs (all labs ordered are listed, but only abnormal results are displayed) Labs Reviewed  GLUCOSE, POCT (MANUAL RESULT ENTRY) - Normal    EKG   Radiology DG Chest Port 1 View Result Date: 04/13/2024 CLINICAL DATA:  Chest pain EXAM: PORTABLE CHEST 1 VIEW COMPARISON:  Chest x-ray performed December 09, 2023 FINDINGS: Low lung volumes with bibasilar hypoventilatory changes. No focal consolidation, pleural effusion, or pneumothorax. IMPRESSION: No active disease. Electronically Signed  By: Maude Naegeli M.D.   On: 04/13/2024 11:16    Procedures Procedures  (including critical care time)  Medications Ordered in UC Medications - No data to display  Initial Impression / Assessment and Plan / UC Course  I have reviewed the triage vital signs and the nursing notes.  Pertinent labs & imaging results that were available during my care of the patient were reviewed by me and considered in my medical decision making (see chart for details).   Patient is well-appearing, normotensive, afebrile, not tachycardic, not tachypneic, oxygenating well on room air.   1. Palpitations 2. Chest pain, unspecified type EKG today shows normal sinus rhythm without ST segment or T wave changes when compared with previous EKG Pain is reproducible on exam Blood sugar is not low; orthostatic vital signs are negative Patient has history of similar pain, however given palpitations which are new symptom, I recommended further evaluation and management in the emergency room to rule out emergent cause Patient is in agreement to plan and is safe to transport via private vehicle  The patient was given the opportunity to ask questions.  All questions answered to their satisfaction.  The patient is in agreement to this plan.   Final Clinical Impressions(s) / UC Diagnoses   Final diagnoses:  Palpitations  Chest pain, unspecified type     Discharge Instructions      Please go directly to the ER for further evaluation and management of your symptoms.    ED Prescriptions   None    PDMP not reviewed this encounter.   Chandra Harlene LABOR, NP 04/13/24 1343

## 2024-04-13 NOTE — ED Triage Notes (Signed)
 Pt states palpitations since waking up this morning.  States it makes him lightheaded. States he has been having CP for 8 months and had a test last week for it but hasn't received any results.

## 2024-04-13 NOTE — Discharge Instructions (Signed)
 Please go directly to the ER for further evaluation and management of your symptoms

## 2024-04-13 NOTE — ED Notes (Signed)
 X-ray at bedside.

## 2024-04-13 NOTE — Discharge Instructions (Signed)
 Follow-up closely with cardiology on an outpatient basis.  Return to emergency department immediately for any new or worsening symptoms.

## 2024-04-13 NOTE — ED Triage Notes (Signed)
 Pt sent from urgent care for evaluation of chest pain and heart palpitations. Pt states it work him from his sleep. Denies any pain yesterday. Pain is coming and going stays on left side of chest and does not radiate. Slight SOB, denies n/v

## 2024-04-14 ENCOUNTER — Telehealth: Payer: Self-pay | Admitting: Cardiovascular Disease

## 2024-04-14 ENCOUNTER — Encounter: Payer: Self-pay | Admitting: Cardiovascular Disease

## 2024-04-14 NOTE — Telephone Encounter (Signed)
 Fredda with [facility] [employer] would like to verify that patient is unable to work at this time. Please advise.

## 2024-04-17 ENCOUNTER — Other Ambulatory Visit: Payer: Self-pay

## 2024-04-17 DIAGNOSIS — R079 Chest pain, unspecified: Secondary | ICD-10-CM

## 2024-04-17 NOTE — Telephone Encounter (Signed)
 Spoke to Mitchell Russell with [facility] and advised that pt has no limitations to work from a cardiology perspective. Mitchell Russell had no further questions or concerns at this time.

## 2024-05-11 ENCOUNTER — Ambulatory Visit: Admitting: Cardiology
